# Patient Record
Sex: Male | Born: 1940 | Race: White | Hispanic: No | Smoking: Never smoker
Health system: Southern US, Community
[De-identification: ages and names within clinical notes are randomized; demographics above are authoritative.]

## PROBLEM LIST (undated history)

## (undated) DIAGNOSIS — E079 Disorder of thyroid, unspecified: Secondary | ICD-10-CM

## (undated) DIAGNOSIS — E039 Hypothyroidism, unspecified: Secondary | ICD-10-CM

## (undated) DIAGNOSIS — R0600 Dyspnea, unspecified: Secondary | ICD-10-CM

## (undated) DIAGNOSIS — J449 Chronic obstructive pulmonary disease, unspecified: Secondary | ICD-10-CM

## (undated) DIAGNOSIS — D649 Anemia, unspecified: Secondary | ICD-10-CM

## (undated) DIAGNOSIS — M1990 Unspecified osteoarthritis, unspecified site: Secondary | ICD-10-CM

## (undated) DIAGNOSIS — H269 Unspecified cataract: Secondary | ICD-10-CM

## (undated) DIAGNOSIS — J45909 Unspecified asthma, uncomplicated: Secondary | ICD-10-CM

## (undated) DIAGNOSIS — C801 Malignant (primary) neoplasm, unspecified: Secondary | ICD-10-CM

## (undated) DIAGNOSIS — I10 Essential (primary) hypertension: Secondary | ICD-10-CM

## (undated) DIAGNOSIS — D6851 Activated protein C resistance: Secondary | ICD-10-CM

## (undated) HISTORY — PX: SHOULDER SURGERY: SHX246

## (undated) HISTORY — DX: Unspecified osteoarthritis, unspecified site: M19.90

## (undated) HISTORY — PX: KNEE SURGERY: SHX244

## (undated) HISTORY — DX: Unspecified asthma, uncomplicated: J45.909

## (undated) HISTORY — PX: EYE SURGERY: SHX253

## (undated) HISTORY — PX: APPENDECTOMY: SHX54

## (undated) HISTORY — DX: Unspecified cataract: H26.9

## (undated) HISTORY — DX: Activated protein C resistance: D68.51

## (undated) HISTORY — DX: Disorder of thyroid, unspecified: E07.9

---
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
## 1992-09-19 DIAGNOSIS — H269 Unspecified cataract: Secondary | ICD-10-CM

## 1992-09-19 HISTORY — DX: Unspecified cataract: H26.9

## 1998-09-19 DIAGNOSIS — C801 Malignant (primary) neoplasm, unspecified: Secondary | ICD-10-CM

## 1998-09-19 HISTORY — DX: Malignant (primary) neoplasm, unspecified: C80.1

## 2004-03-19 ENCOUNTER — Other Ambulatory Visit: Payer: Self-pay

## 2005-01-05 ENCOUNTER — Emergency Department (HOSPITAL_COMMUNITY): Admission: EM | Admit: 2005-01-05 | Discharge: 2005-01-05 | Payer: Self-pay | Admitting: Family Medicine

## 2006-03-13 ENCOUNTER — Ambulatory Visit: Payer: Self-pay | Admitting: General Practice

## 2006-05-25 ENCOUNTER — Other Ambulatory Visit: Payer: Self-pay

## 2006-06-12 ENCOUNTER — Ambulatory Visit: Payer: Self-pay | Admitting: General Practice

## 2006-06-22 ENCOUNTER — Encounter: Payer: Self-pay | Admitting: General Practice

## 2006-07-20 ENCOUNTER — Encounter: Payer: Self-pay | Admitting: General Practice

## 2006-08-19 ENCOUNTER — Encounter: Payer: Self-pay | Admitting: General Practice

## 2006-09-19 ENCOUNTER — Encounter: Payer: Self-pay | Admitting: General Practice

## 2006-10-20 ENCOUNTER — Encounter: Payer: Self-pay | Admitting: General Practice

## 2006-11-18 ENCOUNTER — Encounter: Payer: Self-pay | Admitting: General Practice

## 2008-02-29 ENCOUNTER — Ambulatory Visit: Payer: Self-pay

## 2008-03-14 ENCOUNTER — Ambulatory Visit: Payer: Self-pay | Admitting: General Practice

## 2008-03-25 ENCOUNTER — Ambulatory Visit: Payer: Self-pay | Admitting: General Practice

## 2010-01-18 ENCOUNTER — Ambulatory Visit: Payer: Self-pay

## 2010-06-30 ENCOUNTER — Ambulatory Visit: Payer: Self-pay

## 2010-11-19 ENCOUNTER — Ambulatory Visit: Payer: Self-pay | Admitting: Internal Medicine

## 2010-11-24 ENCOUNTER — Ambulatory Visit: Payer: Self-pay | Admitting: Internal Medicine

## 2010-12-19 ENCOUNTER — Ambulatory Visit: Payer: Self-pay | Admitting: Internal Medicine

## 2011-01-18 ENCOUNTER — Ambulatory Visit: Payer: Self-pay | Admitting: Internal Medicine

## 2011-02-18 ENCOUNTER — Ambulatory Visit: Payer: Self-pay | Admitting: Internal Medicine

## 2011-06-10 ENCOUNTER — Ambulatory Visit: Payer: Self-pay | Admitting: Internal Medicine

## 2011-06-20 ENCOUNTER — Ambulatory Visit: Payer: Self-pay | Admitting: Internal Medicine

## 2011-08-16 ENCOUNTER — Ambulatory Visit: Payer: Self-pay | Admitting: Ophthalmology

## 2011-08-16 DIAGNOSIS — R609 Edema, unspecified: Secondary | ICD-10-CM

## 2011-08-29 ENCOUNTER — Ambulatory Visit: Payer: Self-pay | Admitting: Ophthalmology

## 2011-10-14 ENCOUNTER — Ambulatory Visit: Payer: Self-pay | Admitting: Internal Medicine

## 2011-10-14 LAB — CBC CANCER CENTER
Basophil %: 0.6 %
Eosinophil %: 6.6 %
HCT: 41.7 % (ref 40.0–52.0)
Lymphocyte #: 1 x10 3/mm (ref 1.0–3.6)
MCH: 31.5 pg (ref 26.0–34.0)
Monocyte #: 0.3 x10 3/mm (ref 0.0–0.7)
Monocyte %: 9.2 %
Neutrophil #: 2.2 x10 3/mm (ref 1.4–6.5)
WBC: 3.8 x10 3/mm (ref 3.8–10.6)

## 2011-10-21 ENCOUNTER — Ambulatory Visit: Payer: Self-pay | Admitting: Internal Medicine

## 2013-09-19 HISTORY — PX: COLONOSCOPY: SHX174

## 2013-10-21 ENCOUNTER — Ambulatory Visit: Payer: Self-pay | Admitting: Gastroenterology

## 2014-08-27 ENCOUNTER — Inpatient Hospital Stay: Payer: Self-pay | Admitting: General Surgery

## 2014-08-27 LAB — CBC WITH DIFFERENTIAL/PLATELET
BASOS ABS: 0 10*3/uL (ref 0.0–0.1)
Basophil %: 0.3 %
EOS PCT: 0.6 %
Eosinophil #: 0.1 10*3/uL (ref 0.0–0.7)
HCT: 49 % (ref 40.0–52.0)
HGB: 15.8 g/dL (ref 13.0–18.0)
Lymphocyte #: 0.7 10*3/uL — ABNORMAL LOW (ref 1.0–3.6)
Lymphocyte %: 6.5 %
MCH: 30.5 pg (ref 26.0–34.0)
MCHC: 32.3 g/dL (ref 32.0–36.0)
MCV: 95 fL (ref 80–100)
MONOS PCT: 8.3 %
Monocyte #: 0.9 x10 3/mm (ref 0.2–1.0)
NEUTROS ABS: 8.7 10*3/uL — AB (ref 1.4–6.5)
Neutrophil %: 84.3 %
PLATELETS: 204 10*3/uL (ref 150–440)
RBC: 5.18 10*6/uL (ref 4.40–5.90)
RDW: 14 % (ref 11.5–14.5)
WBC: 10.4 10*3/uL (ref 3.8–10.6)

## 2014-08-27 LAB — URINALYSIS, COMPLETE
Bacteria: NONE SEEN
Bilirubin,UR: NEGATIVE
Blood: NEGATIVE
GLUCOSE, UR: NEGATIVE mg/dL (ref 0–75)
Leukocyte Esterase: NEGATIVE
NITRITE: NEGATIVE
PH: 5 (ref 4.5–8.0)
Protein: NEGATIVE
SPECIFIC GRAVITY: 1.025 (ref 1.003–1.030)
WBC UR: 2 /HPF (ref 0–5)

## 2014-08-27 LAB — COMPREHENSIVE METABOLIC PANEL
ALK PHOS: 98 U/L
ANION GAP: 7 (ref 7–16)
AST: 24 U/L (ref 15–37)
Albumin: 3.4 g/dL (ref 3.4–5.0)
BILIRUBIN TOTAL: 0.5 mg/dL (ref 0.2–1.0)
BUN: 27 mg/dL — ABNORMAL HIGH (ref 7–18)
CALCIUM: 9.5 mg/dL (ref 8.5–10.1)
CHLORIDE: 102 mmol/L (ref 98–107)
CREATININE: 1.11 mg/dL (ref 0.60–1.30)
Co2: 30 mmol/L (ref 21–32)
EGFR (Non-African Amer.): >60
Glucose: 133 mg/dL — ABNORMAL HIGH (ref 65–99)
Osmolality: 285 (ref 275–301)
Potassium: 4.6 mmol/L (ref 3.5–5.1)
SGPT (ALT): 20 U/L
Sodium: 139 mmol/L (ref 136–145)
TOTAL PROTEIN: 7.6 g/dL (ref 6.4–8.2)

## 2014-08-27 LAB — LIPASE, BLOOD: LIPASE: 85 U/L (ref 73–393)

## 2014-08-27 LAB — PROTIME-INR
INR: 2.3
Prothrombin Time: 24.4 secs — ABNORMAL HIGH (ref 11.5–14.7)

## 2014-08-27 LAB — AMYLASE: Amylase: 17 U/L — ABNORMAL LOW (ref 25–115)

## 2014-08-27 LAB — MAGNESIUM: Magnesium: 1.9 mg/dL

## 2014-08-27 LAB — TSH: Thyroid Stimulating Horm: 2.98 u[IU]/mL

## 2014-08-28 DIAGNOSIS — K565 Intestinal adhesions [bands] with obstruction (postprocedural) (postinfection): Secondary | ICD-10-CM

## 2014-08-28 LAB — CBC WITH DIFFERENTIAL/PLATELET
BASOS PCT: 0.5 %
Basophil #: 0 10*3/uL (ref 0.0–0.1)
Eosinophil #: 0.2 10*3/uL (ref 0.0–0.7)
Eosinophil %: 2.2 %
HCT: 43.1 % (ref 40.0–52.0)
HGB: 14.1 g/dL (ref 13.0–18.0)
LYMPHS ABS: 1 10*3/uL (ref 1.0–3.6)
Lymphocyte %: 10.6 %
MCH: 30.9 pg (ref 26.0–34.0)
MCHC: 32.8 g/dL (ref 32.0–36.0)
MCV: 94 fL (ref 80–100)
MONO ABS: 1 x10 3/mm (ref 0.2–1.0)
MONOS PCT: 10.4 %
NEUTROS PCT: 76.3 %
Neutrophil #: 7.3 10*3/uL — ABNORMAL HIGH (ref 1.4–6.5)
Platelet: 180 10*3/uL (ref 150–440)
RBC: 4.58 10*6/uL (ref 4.40–5.90)
RDW: 13.7 % (ref 11.5–14.5)
WBC: 9.5 10*3/uL (ref 3.8–10.6)

## 2014-08-28 LAB — BASIC METABOLIC PANEL
ANION GAP: 8 (ref 7–16)
BUN: 24 mg/dL — ABNORMAL HIGH (ref 7–18)
CALCIUM: 9 mg/dL (ref 8.5–10.1)
CHLORIDE: 106 mmol/L (ref 98–107)
CO2: 28 mmol/L (ref 21–32)
Creatinine: 0.98 mg/dL (ref 0.60–1.30)
EGFR (Non-African Amer.): >60
GLUCOSE: 102 mg/dL — AB (ref 65–99)
OSMOLALITY: 287 (ref 275–301)
Potassium: 4.2 mmol/L (ref 3.5–5.1)
Sodium: 142 mmol/L (ref 136–145)

## 2014-08-29 ENCOUNTER — Encounter: Payer: Self-pay | Admitting: General Surgery

## 2014-08-29 DIAGNOSIS — K565 Intestinal adhesions [bands] with obstruction (postprocedural) (postinfection): Secondary | ICD-10-CM

## 2014-08-29 LAB — PROTIME-INR
INR: 3
PROTHROMBIN TIME: 30.2 s — AB (ref 11.5–14.7)

## 2014-08-29 LAB — BASIC METABOLIC PANEL
Anion Gap: 8 (ref 7–16)
BUN: 23 mg/dL — ABNORMAL HIGH (ref 7–18)
CHLORIDE: 108 mmol/L — AB (ref 98–107)
Calcium, Total: 8.6 mg/dL (ref 8.5–10.1)
Co2: 26 mmol/L (ref 21–32)
Creatinine: 0.81 mg/dL (ref 0.60–1.30)
EGFR (African American): >60
Glucose: 91 mg/dL (ref 65–99)
OSMOLALITY: 286 (ref 275–301)
Potassium: 4 mmol/L (ref 3.5–5.1)
Sodium: 142 mmol/L (ref 136–145)

## 2014-08-29 LAB — URINE CULTURE

## 2014-08-30 LAB — BASIC METABOLIC PANEL
Anion Gap: 6 — ABNORMAL LOW (ref 7–16)
BUN: 19 mg/dL — AB (ref 7–18)
CHLORIDE: 109 mmol/L — AB (ref 98–107)
CREATININE: 0.79 mg/dL (ref 0.60–1.30)
Calcium, Total: 8.4 mg/dL — ABNORMAL LOW (ref 8.5–10.1)
Co2: 26 mmol/L (ref 21–32)
GLUCOSE: 67 mg/dL (ref 65–99)
Osmolality: 282 (ref 275–301)
Potassium: 3.8 mmol/L (ref 3.5–5.1)
Sodium: 141 mmol/L (ref 136–145)

## 2014-08-30 LAB — CBC WITH DIFFERENTIAL/PLATELET
Basophil #: 0 10*3/uL (ref 0.0–0.1)
Basophil %: 0.4 %
Eosinophil #: 0.4 10*3/uL (ref 0.0–0.7)
Eosinophil %: 6.6 %
HCT: 40.2 % (ref 40.0–52.0)
HGB: 13.1 g/dL (ref 13.0–18.0)
Lymphocyte #: 0.7 10*3/uL — ABNORMAL LOW (ref 1.0–3.6)
Lymphocyte %: 9.7 %
MCH: 30.5 pg (ref 26.0–34.0)
MCHC: 32.5 g/dL (ref 32.0–36.0)
MCV: 94 fL (ref 80–100)
MONO ABS: 0.6 x10 3/mm (ref 0.2–1.0)
Monocyte %: 8.6 %
Neutrophil #: 5 10*3/uL (ref 1.4–6.5)
Neutrophil %: 74.7 %
PLATELETS: 165 10*3/uL (ref 150–440)
RBC: 4.28 10*6/uL — ABNORMAL LOW (ref 4.40–5.90)
RDW: 13.7 % (ref 11.5–14.5)
WBC: 6.7 10*3/uL (ref 3.8–10.6)

## 2014-08-30 LAB — PROTIME-INR
INR: 2.7
Prothrombin Time: 27.8 secs — ABNORMAL HIGH (ref 11.5–14.7)

## 2014-08-31 LAB — CBC WITH DIFFERENTIAL/PLATELET
BASOS PCT: 0.3 %
Basophil #: 0 10*3/uL (ref 0.0–0.1)
EOS PCT: 3.2 %
Eosinophil #: 0.2 10*3/uL (ref 0.0–0.7)
HCT: 38.4 % — AB (ref 40.0–52.0)
HGB: 12.7 g/dL — AB (ref 13.0–18.0)
Lymphocyte #: 0.7 10*3/uL — ABNORMAL LOW (ref 1.0–3.6)
Lymphocyte %: 10.6 %
MCH: 30.7 pg (ref 26.0–34.0)
MCHC: 33 g/dL (ref 32.0–36.0)
MCV: 93 fL (ref 80–100)
Monocyte #: 0.8 x10 3/mm (ref 0.2–1.0)
Monocyte %: 12.4 %
NEUTROS ABS: 4.6 10*3/uL (ref 1.4–6.5)
Neutrophil %: 73.5 %
Platelet: 164 10*3/uL (ref 150–440)
RBC: 4.13 10*6/uL — AB (ref 4.40–5.90)
RDW: 13.6 % (ref 11.5–14.5)
WBC: 6.3 10*3/uL (ref 3.8–10.6)

## 2014-08-31 LAB — BASIC METABOLIC PANEL
Anion Gap: 9 (ref 7–16)
BUN: 13 mg/dL (ref 7–18)
CO2: 26 mmol/L (ref 21–32)
Calcium, Total: 8.1 mg/dL — ABNORMAL LOW (ref 8.5–10.1)
Chloride: 106 mmol/L (ref 98–107)
Creatinine: 0.63 mg/dL (ref 0.60–1.30)
Glucose: 85 mg/dL (ref 65–99)
OSMOLALITY: 281 (ref 275–301)
POTASSIUM: 3.7 mmol/L (ref 3.5–5.1)
SODIUM: 141 mmol/L (ref 136–145)

## 2014-08-31 LAB — PROTIME-INR
INR: 2.4
PROTHROMBIN TIME: 25.9 s — AB (ref 11.5–14.7)

## 2014-09-01 HISTORY — PX: COLON SURGERY: SHX602

## 2014-09-01 LAB — URIC ACID: URIC ACID: 7 mg/dL (ref 3.5–7.2)

## 2014-09-01 LAB — PROTIME-INR
INR: 2.8
PROTHROMBIN TIME: 28.4 s — AB (ref 11.5–14.7)

## 2014-09-02 ENCOUNTER — Encounter: Payer: Self-pay | Admitting: General Surgery

## 2014-09-02 LAB — CBC WITH DIFFERENTIAL/PLATELET
BASOS ABS: 0 10*3/uL (ref 0.0–0.1)
BASOS PCT: 0.2 %
EOS PCT: 1.2 %
Eosinophil #: 0.1 10*3/uL (ref 0.0–0.7)
HCT: 37.2 % — AB (ref 40.0–52.0)
HGB: 12.1 g/dL — AB (ref 13.0–18.0)
LYMPHS ABS: 0.3 10*3/uL — AB (ref 1.0–3.6)
Lymphocyte %: 5.2 %
MCH: 30.2 pg (ref 26.0–34.0)
MCHC: 32.6 g/dL (ref 32.0–36.0)
MCV: 93 fL (ref 80–100)
MONO ABS: 0.5 x10 3/mm (ref 0.2–1.0)
Monocyte %: 9.4 %
NEUTROS ABS: 4.6 10*3/uL (ref 1.4–6.5)
Neutrophil %: 84 %
PLATELETS: 160 10*3/uL (ref 150–440)
RBC: 4.01 10*6/uL — ABNORMAL LOW (ref 4.40–5.90)
RDW: 13.6 % (ref 11.5–14.5)
WBC: 5.5 10*3/uL (ref 3.8–10.6)

## 2014-09-02 LAB — BASIC METABOLIC PANEL
ANION GAP: 6 — AB (ref 7–16)
BUN: 15 mg/dL (ref 7–18)
CALCIUM: 8.7 mg/dL (ref 8.5–10.1)
CO2: 29 mmol/L (ref 21–32)
Chloride: 105 mmol/L (ref 98–107)
Creatinine: 0.84 mg/dL (ref 0.60–1.30)
EGFR (Non-African Amer.): >60
Glucose: 138 mg/dL — ABNORMAL HIGH (ref 65–99)
OSMOLALITY: 282 (ref 275–301)
Potassium: 4 mmol/L (ref 3.5–5.1)
SODIUM: 140 mmol/L (ref 136–145)

## 2014-09-02 LAB — PROTIME-INR
INR: 1.3
PROTHROMBIN TIME: 16.2 s — AB (ref 11.5–14.7)

## 2014-09-03 LAB — BASIC METABOLIC PANEL
ANION GAP: 2 — AB (ref 7–16)
BUN: 13 mg/dL (ref 7–18)
CALCIUM: 8.4 mg/dL — AB (ref 8.5–10.1)
CHLORIDE: 107 mmol/L (ref 98–107)
CO2: 34 mmol/L — AB (ref 21–32)
CREATININE: 0.71 mg/dL (ref 0.60–1.30)
EGFR (African American): >60
GLUCOSE: 144 mg/dL — AB (ref 65–99)
OSMOLALITY: 288 (ref 275–301)
Potassium: 3.8 mmol/L (ref 3.5–5.1)
SODIUM: 143 mmol/L (ref 136–145)

## 2014-09-03 LAB — CBC WITH DIFFERENTIAL/PLATELET
BASOS ABS: 0 10*3/uL (ref 0.0–0.1)
Basophil %: 0.3 %
EOS ABS: 0 10*3/uL (ref 0.0–0.7)
EOS PCT: 0.4 %
HCT: 37.3 % — AB (ref 40.0–52.0)
HGB: 12.1 g/dL — AB (ref 13.0–18.0)
LYMPHS ABS: 0.7 10*3/uL — AB (ref 1.0–3.6)
LYMPHS PCT: 11.5 %
MCH: 30.1 pg (ref 26.0–34.0)
MCHC: 32.5 g/dL (ref 32.0–36.0)
MCV: 93 fL (ref 80–100)
MONO ABS: 0.7 x10 3/mm (ref 0.2–1.0)
Monocyte %: 11.6 %
NEUTROS ABS: 4.7 10*3/uL (ref 1.4–6.5)
NEUTROS PCT: 76.2 %
PLATELETS: 192 10*3/uL (ref 150–440)
RBC: 4.02 10*6/uL — ABNORMAL LOW (ref 4.40–5.90)
RDW: 13.5 % (ref 11.5–14.5)
WBC: 6.1 10*3/uL (ref 3.8–10.6)

## 2014-09-04 ENCOUNTER — Encounter: Payer: Self-pay | Admitting: General Surgery

## 2014-09-04 LAB — PROTIME-INR
INR: 3.8
Prothrombin Time: 36.4 secs — ABNORMAL HIGH (ref 11.5–14.7)

## 2014-09-05 LAB — CBC WITH DIFFERENTIAL/PLATELET
BASOS ABS: 0 10*3/uL (ref 0.0–0.1)
Basophil %: 0.3 %
EOS PCT: 4.7 %
Eosinophil #: 0.3 10*3/uL (ref 0.0–0.7)
HCT: 36.2 % — ABNORMAL LOW (ref 40.0–52.0)
HGB: 11.9 g/dL — AB (ref 13.0–18.0)
LYMPHS ABS: 1.1 10*3/uL (ref 1.0–3.6)
Lymphocyte %: 19.6 %
MCH: 30.4 pg (ref 26.0–34.0)
MCHC: 32.9 g/dL (ref 32.0–36.0)
MCV: 92 fL (ref 80–100)
MONOS PCT: 9.7 %
Monocyte #: 0.5 x10 3/mm (ref 0.2–1.0)
Neutrophil #: 3.6 10*3/uL (ref 1.4–6.5)
Neutrophil %: 65.7 %
Platelet: 182 10*3/uL (ref 150–440)
RBC: 3.92 10*6/uL — AB (ref 4.40–5.90)
RDW: 13.6 % (ref 11.5–14.5)
WBC: 5.5 10*3/uL (ref 3.8–10.6)

## 2014-09-05 LAB — BASIC METABOLIC PANEL
ANION GAP: 5 — AB (ref 7–16)
BUN: 11 mg/dL (ref 7–18)
CALCIUM: 8.1 mg/dL — AB (ref 8.5–10.1)
CHLORIDE: 103 mmol/L (ref 98–107)
CO2: 35 mmol/L — AB (ref 21–32)
Creatinine: 0.77 mg/dL (ref 0.60–1.30)
EGFR (African American): >60
Glucose: 97 mg/dL (ref 65–99)
OSMOLALITY: 284 (ref 275–301)
Potassium: 3.4 mmol/L — ABNORMAL LOW (ref 3.5–5.1)
SODIUM: 143 mmol/L (ref 136–145)

## 2014-09-05 LAB — PROTIME-INR
INR: 3.1
PROTHROMBIN TIME: 30.8 s — AB (ref 11.5–14.7)

## 2014-09-08 ENCOUNTER — Encounter: Payer: Self-pay | Admitting: General Surgery

## 2014-09-08 ENCOUNTER — Ambulatory Visit (INDEPENDENT_AMBULATORY_CARE_PROVIDER_SITE_OTHER): Payer: Self-pay | Admitting: General Surgery

## 2014-09-08 VITALS — BP 140/70 | HR 76 | Resp 14 | Ht 68.0 in | Wt 186.0 lb

## 2014-09-08 DIAGNOSIS — N401 Enlarged prostate with lower urinary tract symptoms: Secondary | ICD-10-CM | POA: Insufficient documentation

## 2014-09-08 DIAGNOSIS — N400 Enlarged prostate without lower urinary tract symptoms: Secondary | ICD-10-CM

## 2014-09-08 DIAGNOSIS — K565 Intestinal adhesions [bands], unspecified as to partial versus complete obstruction: Secondary | ICD-10-CM | POA: Insufficient documentation

## 2014-09-08 DIAGNOSIS — R338 Other retention of urine: Secondary | ICD-10-CM

## 2014-09-08 NOTE — Progress Notes (Signed)
Patient ID: Tony Hinton, male   DOB: Jan 18, 1941, 73 y.o.   MRN: [id number]  Chief Complaint  Patient presents with  . Routine Post Op    celiotomy    HPI Tony Hinton is a 73 y.o. male who presents for a post op of a celiotomy. The procedure was performed on 09/01/14. The patient is doing well. Bowels are moving. No complaints at this time.   The patient required cystoscopic passage of a urinary bladder catheter after his procedure.  HPI  Past Medical History  Diagnosis Date  . Arthritis   . Asthma   . Factor 5 Leiden mutation, heterozygous   . Cataract 1994    testicular  . Thyroid disease     Past Surgical History  Procedure Laterality Date  . Colon surgery  09/01/2014  . Shoulder surgery Left   . Appendectomy    . Knee surgery Right     History reviewed. No pertinent family history.  Social History History  Substance Use Topics  . Smoking status: Never Smoker   . Smokeless tobacco: Not on file  . Alcohol Use: 0.0 oz/week    0 Not specified per week    Allergies  Allergen Reactions  . Penicillins   . Shellfish Allergy Other (See Comments)    Effects his eyes  . Sulfa Antibiotics Other (See Comments)    Effects eyes    Current Outpatient Prescriptions  Medication Sig Dispense Refill  . allopurinol (ZYLOPRIM) 100 MG tablet Take 100 mg by mouth daily.  1  . folic acid (FOLVITE) 1 MG tablet   1  . HYDROcodone-acetaminophen (NORCO/VICODIN) 5-325 MG per tablet   0  . levothyroxine (SYNTHROID, LEVOTHROID) 50 MCG tablet   1  . PROVENTIL HFA 108 (90 BASE) MCG/ACT inhaler   1  . warfarin (COUMADIN) 1 MG tablet   1  . warfarin (COUMADIN) 7.5 MG tablet   1   No current facility-administered medications for this visit.    Review of Systems Review of Systems  Constitutional: Negative.   Respiratory: Negative.   Cardiovascular: Negative.   Gastrointestinal: Negative.     Blood pressure 140/70, pulse 76, resp. rate 14, height 5\' 8"  (1.727 m), weight 186  lb (84.369 kg).  Physical Exam Physical Exam  Constitutional: He is oriented to person, place, and time. He appears well-developed and well-nourished.  Cardiovascular: Normal rate, regular rhythm and normal heart sounds.   No murmur heard. Pulmonary/Chest: Effort normal and breath sounds normal.  Neurological: He is alert and oriented to person, place, and time.  Skin: Skin is warm and dry.  The patient has a well-healed midline incision. No weakness noted.  Data Reviewed Urinary catheter removed without difficulty.  Assessment    Doing well status post release of adhesive band causing small bowel obstruction  Status post urinary catheter placement for BPH/false passage.    Plan    The patient will follow-up in one month. He was instructed to notify the urology service if he is unable to void later today.    PCP:  Arther Dames, Judene Companion 09/08/2014, 8:38 PM

## 2014-09-08 NOTE — Patient Instructions (Signed)
Patient to return in 3 weeks for follow up. The patient is aware to call back for any questions or concerns.

## 2014-09-30 ENCOUNTER — Encounter: Payer: Self-pay | Admitting: General Surgery

## 2014-09-30 ENCOUNTER — Ambulatory Visit (INDEPENDENT_AMBULATORY_CARE_PROVIDER_SITE_OTHER): Payer: Self-pay | Admitting: General Surgery

## 2014-09-30 VITALS — BP 118/66 | HR 74 | Resp 14 | Ht 66.0 in | Wt 180.0 lb

## 2014-09-30 DIAGNOSIS — K565 Intestinal adhesions [bands], unspecified as to partial versus complete obstruction: Secondary | ICD-10-CM

## 2014-09-30 NOTE — Progress Notes (Addendum)
Patient ID: Tony Hinton, male   DOB: 08-02-1941, 74 y.o.   MRN: [id number]  Chief Complaint  Patient presents with  . Follow-up    small bowel     HPI Tony Hinton is a 74 y.o. male who presents for a post op having undergone exploratory celiotomy and lysis of adhesions for small bowel obstruction. The procedure was performed on 09/01/14. The patient is doing well. Bowels are moving. No complaints at this time.  The patient's report his bowel movements are returning to normal in frequency and consistency. He is not experiencing any postprandial pain. He does report a general sense of muscle weakness that is gradually improving.  The patient reports improved voiding having undergone cystoscopic catheter placement immediately post operatively.  HPI  Past Medical History  Diagnosis Date  . Arthritis   . Asthma   . Factor 5 Leiden mutation, heterozygous   . Cataract 1994    testicular  . Thyroid disease     Past Surgical History  Procedure Laterality Date  . Colon surgery  09/01/2014  . Shoulder surgery Left   . Appendectomy    . Knee surgery Right     No family history on file.  Social History History  Substance Use Topics  . Smoking status: Never Smoker   . Smokeless tobacco: Not on file  . Alcohol Use: 0.0 oz/week    0 Not specified per week    Allergies  Allergen Reactions  . Penicillins   . Sulfa Antibiotics Other (See Comments)    Effects eyes  . Shellfish Allergy Other (See Comments) and Rash    Effects his eyes    Current Outpatient Prescriptions  Medication Sig Dispense Refill  . allopurinol (ZYLOPRIM) 100 MG tablet Take 100 mg by mouth daily.  1  . folic acid (FOLVITE) 1 MG tablet   1  . levothyroxine (SYNTHROID, LEVOTHROID) 50 MCG tablet   1  . PROVENTIL HFA 108 (90 BASE) MCG/ACT inhaler   1  . warfarin (COUMADIN) 1 MG tablet   1  . warfarin (COUMADIN) 7.5 MG tablet   1   No current facility-administered medications for this visit.    Review of  Systems Review of Systems  Constitutional: Positive for chills. Negative for fever, diaphoresis, activity change, appetite change, fatigue and unexpected weight change.  Respiratory: Negative.   Cardiovascular: Negative.     Blood pressure 118/66, pulse 74, resp. rate 14, height 5\' 6"  (1.676 m), weight 180 lb (81.647 kg).  Physical Exam Physical Exam  Constitutional: He is oriented to person, place, and time. He appears well-developed and well-nourished.  Eyes: Conjunctivae are normal. No scleral icterus.  Neck: Neck supple.  Cardiovascular: Normal rate, regular rhythm and normal heart sounds.   Pulmonary/Chest: Effort normal and breath sounds normal.  Abdominal: Soft. Normal appearance and bowel sounds are normal. There is no tenderness.    Lymphadenopathy:    He has no cervical adenopathy.  Neurological: He is alert and oriented to person, place, and time.  Skin: Skin is warm and dry.    Data Reviewed None  Assessment    Doing well status post release of small bowel obstruction.     Plan    The patient will take this week to increase his activity with walking to develop improved exercise tolerance and plan on return to work with light duty (no lifting over 20 pounds) on 10/06/2014. We'll plan for a follow-up examination here in 1 month.  PCP:  Arther Dames,   Robert Bellow 10/01/2014, 7:04 AM

## 2014-09-30 NOTE — Patient Instructions (Signed)
Patient to return ion one month.

## 2014-10-30 ENCOUNTER — Encounter: Payer: Self-pay | Admitting: General Surgery

## 2014-10-30 ENCOUNTER — Ambulatory Visit (INDEPENDENT_AMBULATORY_CARE_PROVIDER_SITE_OTHER): Payer: Self-pay | Admitting: General Surgery

## 2014-10-30 VITALS — BP 148/86 | HR 56 | Resp 12 | Ht 69.0 in | Wt 189.0 lb

## 2014-10-30 DIAGNOSIS — K565 Intestinal adhesions [bands], unspecified as to partial versus complete obstruction: Secondary | ICD-10-CM

## 2014-10-30 NOTE — Progress Notes (Signed)
Patient ID: Tony Hinton, male   DOB: 05-23-1941, 74 y.o.   MRN: [id number]  Chief Complaint  Patient presents with  . Follow-up    HPI Tony Hinton is a 74 y.o. male.  who presents for a post op having undergone exploratory celiotomy and lysis of adhesions for small bowel obstruction. The procedure was performed on 09/01/14. The patient is doing well. Bowels are moving. No complaints at this time. The patient's report his bowel movements are returning to normal in frequency and consistency. Bowels generally move every other day. No new complaints. The patient has returned to work.   HPI  Past Medical History  Diagnosis Date  . Arthritis   . Asthma   . Factor 5 Leiden mutation, heterozygous   . Cataract 1994    testicular  . Thyroid disease     Past Surgical History  Procedure Laterality Date  . Colon surgery  09/01/2014  . Shoulder surgery Left   . Appendectomy    . Knee surgery Right   . Colonoscopy  2015    History reviewed. No pertinent family history.  Social History History  Substance Use Topics  . Smoking status: Never Smoker   . Smokeless tobacco: Not on file  . Alcohol Use: 0.0 oz/week    0 Standard drinks or equivalent per week    Allergies  Allergen Reactions  . Penicillins   . Sulfa Antibiotics Other (See Comments)    Effects eyes  . Shellfish Allergy Other (See Comments) and Rash    Effects his eyes    Current Outpatient Prescriptions  Medication Sig Dispense Refill  . allopurinol (ZYLOPRIM) 100 MG tablet Take 100 mg by mouth daily.  1  . folic acid (FOLVITE) 1 MG tablet   1  . levothyroxine (SYNTHROID, LEVOTHROID) 50 MCG tablet   1  . PROVENTIL HFA 108 (90 BASE) MCG/ACT inhaler   1  . warfarin (COUMADIN) 1 MG tablet Take 0.5 mg by mouth daily at 6 PM.   1  . warfarin (COUMADIN) 7.5 MG tablet Take 7.5 mg by mouth daily at 6 PM.   1   No current facility-administered medications for this visit.    Review of Systems Review of Systems   Constitutional: Negative.   Respiratory: Negative.   Cardiovascular: Negative.   Gastrointestinal: Negative for nausea, vomiting, abdominal pain, diarrhea and constipation.    Blood pressure 148/86, pulse 56, resp. rate 12, height 5\' 9"  (1.753 m), weight 189 lb (85.73 kg).  The patient's weight is up 9 pounds from his last visit.  Physical Exam Physical Exam  Constitutional: He is oriented to person, place, and time. He appears well-developed and well-nourished.  Abdominal: Soft. Normal appearance. There is no tenderness. No hernia.    Abdominal incision well healed.  Neurological: He is alert and oriented to person, place, and time.  Skin: Skin is warm and dry.      Assessment    Doing well status post release of small bowel obstruction    Plan    The patient is released to full activity. He'll use good judgment was strenuous activity.     Follow up as needed. Proper lifting techniques reviewed. The patient is aware to call back for any questions or concerns.  PCP:  Earvin Hansen 10/31/2014, 7:38 PM

## 2014-10-30 NOTE — Patient Instructions (Signed)
Follow up as needed. Proper lifting techniques reviewed. The patient is aware to call back for any questions or concerns.

## 2014-10-31 ENCOUNTER — Encounter: Payer: Self-pay | Admitting: General Surgery

## 2015-01-10 NOTE — Consult Note (Signed)
Chief Complaint:  Subjective/Chief Complaint Foley catheter draining well this a.m.  He has no GU complaints.  Preoperative voiding symptoms reviewed, he does endorse small caliber stream but no other obstructive voiding symptoms.   VITAL SIGNS/ANCILLARY NOTES: **Vital Signs.:   15-Dec-15 02:10  Vital Signs Type Routine  Temperature Temperature (F) 97.5  Celsius 36.3  Temperature Source oral  Pulse Pulse 64  Respirations Respirations 18  Systolic BP Systolic BP 654  Diastolic BP (mmHg) Diastolic BP (mmHg) 76  Mean BP 95  Pulse Ox % Pulse Ox % 92  Oxygen Delivery Room Air/ 21 %   Brief Assessment:  GEN well developed, well nourished, no acute distress   Cardiac -- LE edema   Gastrointestinal NG tube in place, lower abdominal incision dressing clean dry and intact   Gastrointestinal details normal Nondistended  appropriately tender   EXTR negative edema   Additional Physical Exam Foley catheter in place anchored to right thigh, draining clear yellow urine   Lab Results: Routine Chem:  15-Dec-15 04:39   Glucose, Serum  138  BUN 15  Sodium, Serum 140  Potassium, Serum 4.0  Chloride, Serum 105  CO2, Serum 29  Calcium (Total), Serum 8.7  Anion Gap  6  Osmolality (calc) 282  eGFR (African American) >60  eGFR (Non-African American) >60 (eGFR values <5m/min/1.73 m2 may be an indication of chronic kidney disease (CKD). Calculated eGFR, using the MRDR Study equation, is useful in  patients with stable renal function. The eGFR calculation will not be reliable in acutely ill patients when serum creatinine is changing rapidly. It is not useful in patients on dialysis. The eGFR calculation may not be applicable to patients at the low and high extremes of body sizes, pregnant women, and vegetarians.)  Routine Coag:  15-Dec-15 04:39   Prothrombin  16.2  INR 1.3 (INR reference interval applies to patients on anticoagulant therapy. A single INR therapeutic range for  coumarins is not optimal for all indications; however, the suggested range for most indications is 2.0 - 3.0. Exceptions to the INR Reference Range may include: Prosthetic heart valves, acute myocardial infarction, prevention of myocardial infarction, and combinations of aspirin and anticoagulant. The need for a higher or lower target INR must be assessed individually. Reference: The Pharmacology and Management of the Vitamin K  antagonists: the seventh ACCP Conference on Antithrombotic and Thrombolytic Therapy. CYTKPT.4656Sept:126 (3suppl): [id number] A HCT value >55% may artifactually increase the PT.  In one study,  the increase was an average of 25%. Reference:  "Effect on Routine and Special Coagulation Testing Values of Citrate Anticoagulant Adjustment in Patients with High HCT Values." American Journal of Clinical Pathology 2006;126:400-405.)  Routine Hem:  15-Dec-15 04:39   WBC (CBC) 5.5  RBC (CBC)  4.01  Hemoglobin (CBC)  12.1  Hematocrit (CBC)  37.2  Platelet Count (CBC) 160  MCV 93  MCH 30.2  MCHC 32.6  RDW 13.6  Neutrophil % 84.0  Lymphocyte % 5.2  Monocyte % 9.4  Eosinophil % 1.2  Basophil % 0.2  Neutrophil # 4.6  Lymphocyte #  0.3  Monocyte # 0.5  Eosinophil # 0.1  Basophil # 0.0 (Result(s) reported on 02 Sep 2014 at 05:08AM.)   Assessment/Plan:  Invasive Device Daily Assessment of Necessity:  Does the patient currently have any of the following indwelling devices? 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  Suspect underlying stricutre related to history of pelvic radiation.   -Please maintain Foley catheter 5-7 days to allow urethral healing, recurrence of stricture -if patient is discharged prior to this,  please call our office to arrange outpatient voiding trial -plan discussed with patient today in detail   Electronic Signatures: Sherlynn Stalls (MD)  (Signed 15-Dec-15 08:51)  Authored: Chief Complaint, VITAL SIGNS/ANCILLARY NOTES, Brief Assessment, Lab Results, Assessment/Plan   Last Updated: 15-Dec-15 08:51 by Sherlynn Stalls (MD)

## 2015-01-10 NOTE — Op Note (Signed)
PATIENT NAME:  Tony Hinton, Tony Hinton MR#:  [id number] DATE OF BIRTH:  08-03-1941  DATE OF PROCEDURE:  09/01/2014  PREOPERATIVE DIAGNOSIS: Small bowel obstruction.   POSTOPERATIVE DIAGNOSIS: Small bowel obstruction.  OPERATIVE PROCEDURE: Exploratory celiotomy, release of band adhesion.   SURGEON: Hervey Ard, MD   ANESTHESIA: General endotracheal under Dr. Benjamine Mola.   ESTIMATED BLOOD LOSS: 25 mL.  FLUID REPLACEMENT: Crystalloid 350 mL.   CLINICAL NOTE: This 74 year old male was admitted with nausea and vomiting and plain films suggestive of partial small bowel obstruction. A nasogastric tube was placed while attempts were made to correct his elevated pro time. 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 He was felt to be a candidate for exploration.   OPERATIVE NOTE: With the patient under adequate general endotracheal anesthesia, Foley catheter was attempted to be placed by the nurse and myself. It was not possible to pass the prosthetic urethra. This was initially tried with a 71 [place] and then a 14 [place] catheter. In spite of lubricating the urethra, (which required slight dilatation of urethral stricture at the very opening on the glans) it was not possible to pass the catheter. The abdomen was prepped with ChloraPrep and draped. A lower midline incision was made and carried down through the skin and subcutaneous tissue with hemostasis achieved by electrocautery. Of note, the patient received 2 units of fresh frozen plasma immediately prior to the procedure due to an elevated PT, INR of 2.8. No unusual bleeding was encountered, The abdomen was opened. No odor. Scant free fluid. Inspection showed dilated loops of small bowel just to the left of the midline and decompressed bowel just right of the midline. The patient was found to have a single adhesive band  between the retroperitoneum and the base of the small bowel mesentery to the adjacent colon. This had compressed the bowel with proximal dilatation and mild erythema but no evidence of ischemia. This band was freed under direct vision and the remaining small bowel was run from the ligament of Treitz to just proximal to the terminal ileum (where adhesions of the omentum to the decompressed right colon and TI prevented easy visualization). No other areas of concern were noted. The pelvis was otherwise free of adhesive disease. The succus entericus was milked proximally and approximately [phone] mL plus air was returned through the nasogastric tube. This was initially found to be just barely within the gastric body and with advancement by the nurse anesthetist was placed into the body of the stomach with good return. The abdomen was irrigated with warm saline. With the sponge, tape and instrument count correct, the peritoneum was closed with a running 2-0 Vicryl. The rectus was closed with interrupted 0 PDS sutures. The adipose layer was closed in layers with running 2-0 Vicryl after irrigation. Skin was closed with staples. A dry honeycomb dressing was applied and the patient taken to the recovery room.   Attempts to catheterize the bladder with an 51 French coude catheter were unsuccessful and Dr. Hollice Espy from urology was kind enough to come in and see the patient and place a catheter.    ____________________________ Robert Bellow, MD jwb:TT D: 09/01/2014 21:38:53 ET T: 09/01/2014 22:07:46 ET JOB#: [id number]  cc: Robert Bellow, MD, <Dictator> Adin Hector, MD Sian Joles Amedeo Kinsman MD ELECTRONICALLY SIGNED 09/02/2014 19:07

## 2015-01-10 NOTE — Consult Note (Signed)
PATIENT NAME:  Tony Hinton, Tony Hinton MR#:  [id number] DATE OF BIRTH:  [birth year]  DATE OF CONSULTATION:  08/28/2014  REFERRING PHYSICIAN:   CONSULTING PHYSICIAN:  S.G. Jamal Collin, MD  REQUESTING PHYSICIAN: Dr. Ramonita Lab.    REASON: Nausea, vomiting, suspected bowel obstruction.   HISTORY: This is a 74 year old male who was admitted yesterday evening with a complaint of significant nausea and vomiting. Says he was well up until the beginning of the week and had a normal bowel movement on Monday of this week, which was 3 days ago. Then he started to develop some vague abdominal pain and 2 days ago he developed nausea and vomiting and he has vomited at least 3 times, the last 2 being yesterday on the way to the hospital and after arriving at the hospital. He complained of pain that was nonfocal in the abdomen. It was noted by his wife that the vomitus had a feculent border although the vomit appeared to be mostly bilious in nature. Since admission yesterday he has not had any further episode of vomiting. His pain however it is still there, but not as bad as it has been. He has passed a very little amount of gas and has not had a bowel movement since 3 days ago.   PAST MEDICAL HISTORY: Significant for multiple issues. 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 He has had an appendectomy and a right inguinal hernia repair in the past. 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 He has multiple other histories which are already outlined in the admitting H and P and these were reviewed.   CURRENT MEDICATIONS: Include vitamin D, colchicine, Flexeril, B12, Restasis, folate, Norco, levothyroxine, Proventil inhaler, and Coumadin.   REVIEW OF SYSTEMS: The patient denies any fever or chills. He has abdominal pain and  nausea and vomiting and constipation at present.  No chest pain or shortness of breath. No leg pain at this time.   PHYSICAL EXAMINATION:  GENERAL: The patient is not in any acute distress.  VITAL SIGNS:  Noted to be stable since admission. His heart rate is normal. HEENT: Conjunctivae are pink. Sclerae nonicteric.  NECK: Supple. No nodes or masses palpable.  LUNGS: Clear to auscultation and percussion.  HEART: Sinus rhythm without any murmurs.  ABDOMEN: Soft abdomen which is relatively flat at this time. There is no evidence of any hernias. He has some nonfocal tenderness in his abdomen.   LABORATORY DATA: Reveals normal chemistries. White count is normal, hemoglobin is normal. The patient had a CT scan of the abdomen done this morning which shows some mildly dilated small bowel which extends from the proximal portion all the way to a distal area where there appears to be some sort of a transition in the lower left abdomen. The colon appears normal. There is evidence of a sizable hiatal hernia.   IMPRESSION: History and CT findings suggesting a partial small bowel obstruction. At this point, he seems to have been relatively stable with no further vomiting in the last 24 hours.  I recommended that we continue with the conservative management at this point. I do not think he needs an NG tube, however will try a Fleet enema this evening and followup x-rays tomorrow morning and proceed accordingly.   Thank you for allowing me to  evaluate and help in the care of this patient.     ____________________________ S.Robinette Haines, MD sgs:bu D: 08/28/2014 19:55:48 ET T: 08/28/2014 20:35:44 ET JOB#: [id number]  cc: S.G. Jamal Collin, MD, <Dictator> [facility] Robinette Haines MD ELECTRONICALLY SIGNED 09/01/2014 9:16

## 2015-01-10 NOTE — H&P (Signed)
PATIENT NAME:  Tony Hinton, Tony Hinton MR#:  [id number] DATE OF BIRTH:  10/21/1940  DATE OF ADMISSION:  08/27/2014  CHIEF COMPLAINT: Abdominal pain with refractory nausea and vomiting.   HISTORY OF PRESENT ILLNESS: The patient is a 74 year old male with history of reactive airways disease, BPH, prior clots on chronic anticoagulation therapy secondary to history of factor V Leiden heterozygosity. He states that he was in his usual state of health until about Tuesday, when he began having some epigastric and lower quadrant abdominal discomfort. He then began having vomiting, and this continued over the course of the next 24 hours. Initially the vomit was bilious, but has had 2 episodes where he brought up what is reported as brownish material. His ex-wife, who is a Marine scientist, felt that this looked and smelled like feces.   On examination, he has biliary material on his pants, were he has vomited, although do not see fecal matter. Examination reveals tenderness to deep palpation in the epigastric region.   He is admitted now with refractory nausea and vomiting, possibly with feculent vomiting, which would be worrisome for bowel obstruction.   PAST MEDICAL HISTORY:  1.  Reactive airway disease.  2.  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  3.  Benign prostatic hypertrophy.  4.  History of Fuchs dystrophy of the cornea, status post bilateral cornea transplants.  5.  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  6.  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  7.  History of internal hemorrhoids.  8.  Gout.  9.  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  10.  History of anemia with iron deficiency and folate deficiency.  11.  B12 deficiency with chronic leukopenia.  12.  Status post appendectomy.  13.  History of right knee surgery.  14.   History of left inguinal hernia repair.  15.  History of bilateral cataract repairs.  16.  History of left rotator cuff surgery.  17.  History of right knee arthroscopy, complicated by hematoma.   ALLERGIES: PENICILLIN, SULFA, SHELLFISH.   MEDICATIONS:  1.  Vitamin D 2000 units p.o. daily.  2.  Colchicine  0.6 mg p.o. daily as needed for gout flares.  3.  Vitamin B12 1000 mcg p.o. daily.  4.  Flexeril 10 mg p.o. b.i.d. p.r.n. muscle pain.  5.  Restasis 0.05% drops to both eyes as directed.  6.  Folate 1 mg p.o. daily.  7.  Norco 5/325 1 p.o. q. 8 hours p.r.n. severe pain.  8.  Levothyroxine 0.05 mg p.o. daily for hypothyroidism.  9.  Proventil HFA metered dose inhaler 2 puffs 4 times a day as needed for wheezing.  10.  Coumadin 8 mg p.o. daily.   SOCIAL HISTORY: No tobacco. Remote alcohol.   FAMILY HISTORY: Coronary artery disease and diabetes.   REVIEW OF SYSTEMS: Please see HPI. Denies fevers. Felt like he might have some chills. Last bowel movement about 48 hours ago, without blood or melena. No urinary symptoms. The remainder of a complete review of systems is negative.   PHYSICAL EXAMINATION:  VITAL SIGNS: Temperature 98.5, blood pressure 150/70, pulse 96. Height 5 foot 9 inches, weight 180 pounds.  GENERAL: Well-developed, well-nourished male, who appears ill.  EYES: Pupils are postoperative. Lids and conjunctivae are unremarkable.  EARS, NOSE AND THROAT: External examination unremarkable. The oropharynx is dry without lesions.  NECK: Supple. Trachea  midline. No thyromegaly.  CARDIOVASCULAR: Regular rate and rhythm, without gallops or rubs. Carotid and radial pulses 2+.  LUNGS: Clear bilaterally without wheeze or retractions.  ABDOMEN: Soft, nondistended. Very quiet bowel sounds. Tenderness to deep palpation in the epigastric region without rebound or Murphy's.  SKIN: Diffuse dry skin.  LYMPH NODES: No cervical or supraclavicular nodes.  MUSCULOSKELETAL: No clubbing or  cyanosis. Postoperative changes in the right knee. No edema.  NEUROLOGIC: Cranial nerves are intact. Decreased light touch in both feet is unchanged from previous exams. Motor strength appears intact.   IMPRESSION AND PLAN:  1.  Refractory nausea and vomiting with possible feculent vomiting. We will make him n.p.o., placed him on IV fluid resuscitation, getting STAT MET-C, CBC, lipase, amylase. Get abdominal films to evaluate for obstruction. Pain medications and nausea medications to be used as needed.  2.  History of deep vein thrombosis. We will hold Coumadin for now, until we make sure that he is not going to need some sort of intervention. Checking INR today.  3.  Hypothyroidism. Check TSH and adjust medications if necessary.  4.  Asthma. No evidence of wheezing on exam currently. Albuterol SVNs to be used as needed.    ____________________________ Adin Hector, MD bjk:MT D: 08/27/2014 17:17:52 ET T: 08/27/2014 17:52:34 ET JOB#: [id number]  cc: Adin Hector, MD, <Dictator> Ramonita Lab MD ELECTRONICALLY SIGNED 09/01/2014 8:35

## 2015-01-10 NOTE — Consult Note (Signed)
PATIENT NAME:  Tony Hinton, Tony Hinton MR#:  [id number] DATE OF BIRTH:  02/23/1941  DATE OF CONSULTATION:    REFERRING PHYSICIAN:   CONSULTING PHYSICIAN:  S.G. Jamal Collin, MD  REQUESTING PHYSICIAN: Dr. Ramonita Lab.    REASON: Nausea,  vomiting, suspected bowel obstruction.   HISTORY: This is a 74 year old male who was admitted yesterday evening with a complaint of significant nausea and vomiting. Says he was well up until the beginning of the week and had a normal bowel movement on Monday of this week, which was 3 days ago. Then he started to develop some vague abdominal pain and 2 days ago he developed nausea and vomiting and he has vomited at least 3 times, the last 2 being yesterday on the way to the hospital and after arriving at the hospital. He complained of pain that was nonfocal in the abdomen. It was noted by his wife that the vomitus had a feculent border although the vomit appear to be mostly bilious in nature. Since admission yesterday he has not had any further episode of vomiting. His pain however it is still there, but not as bad as it has been. He has passed a very little amount of gas and has not had a bowel movement since 3 days ago.   PAST MEDICAL HISTORY: Significant for multiple issues. 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 He has had an appendectomy and a right inguinal hernia repair in the past. 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 He has multiple other histories which are already outlined in the admitting H and P and these were reviewed.   CURRENT MEDICATIONS: Include vitamin D, colchicine, Flexeril, B12, Restasis, folate, Norco, levothyroxine, Proventil inhaler, and Coumadin.   REVIEW OF SYSTEMS: The patient denies any fever or chills. He has abdominal pain and nausea and  vomiting and constipation at present.  No chest pain or shortness of breath. No leg pain at this time.   PHYSICAL EXAMINATION:  GENERAL: The patient is not in any acute distress.  VITAL SIGNS:  Noted to be stable since admission. His heart rate is normal. HEENT: Conjunctivae are pink. Sclerae nonicteric.  NECK: Supple. No nodes or masses palpable.  LUNGS: Clear to auscultation and percussion.  HEART: Sinus rhythm without any murmurs.  ABDOMEN: Soft abdomen which is relatively flat at this time. There is no evidence of any hernias. He has some nonfocal tenderness in his abdomen.   LABORATORY DATA: Reveals normal chemistries. White count is normal, hemoglobin is normal. The patient had a CT scan of the abdomen done this morning which shows some mildly dilated small bowel which extends from the proximal portion all the way to a distal area where there appears to be some sort of a transition in the lower left abdomen. The colon appears normal. There is evidence of a sizable hiatal hernia.   IMPRESSION: History and CT findings suggesting a partial small bowel obstruction. At this point, he seems to have been relatively stable with no further vomiting in the last 24 hours.  I recommended that we continue with the conservative management at this point. I do not think he needs an NG tube, however will try a Fleet enema this evening and followup x-rays tomorrow morning and proceed accordingly.   Thank you for allowing me  to evaluate and help in the care of this patient.     ____________________________ S.Robinette Haines, MD sgs:bu D: 08/28/2014 19:55:48 ET T: 08/28/2014 20:34:06 ET JOB#: [id number]  cc: S.G. Jamal Collin, MD, <Dictator> [facility] Robinette Haines MD ELECTRONICALLY SIGNED 09/01/2014 9:15

## 2015-01-14 NOTE — Op Note (Signed)
PATIENT NAME:  Tony Hinton, Tony Hinton MR#:  [id number] DATE OF BIRTH:  1940/11/02  DATE OF PROCEDURE:  09/01/2014  REASON FOR CONSULTATION:  Difficulty with Foley catheter in the operating room.   HISTORY OF PRESENT ILLNESS:  This is a 74 year old male who was taken earlier today to the operating room for treatment of a small-bowel obstruction. Intraoperatively, a Foley catheter was attempted to be placed using several different size catheters including a 16 [place] and 14 [place]. They were unsuccessful in placing the Foley catheter. In the PACU, additional catheters were attempted using an 18 Coude without success with some urethral bleeding. I was asked to attempt Foley catheter placement at the bedside in recovery.   Of note, the patient does not endorse a history of significant voiding symptoms and denies a history of previous Foley catheters or urethral trauma. He does have a history of pelvic radiation for treatment of his testicular cancer in the 90s.   They also attempted placement of a 16 French Coude catheter but met resistance at the level of either the bulb or the prostatic urethra; it is a little unclear. There was gross blood per urethral meatus and on the catheter at the time of this attempt.   PROCEDURE:  The patient was consented and draped in standard sterile fashion. The penis was prepped using Betadine solution and lidocaine gel that was injected per urethral meatus. A 16 French flexible cystoscope was then advanced per urethra into the bulbar urethra, where a blind-ending pouch with some slight oozing was noted. This appeared to be a large false pass and the scope was backed up slightly, at which time a very tiny true lumen was identified at the 11 o'clock position. 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 The wire was passed under direct visualization through this narrow passage into the bladder. The 6 French cystoscope was able to be passed into the  bladder itself but with some difficulty through this tight area. The wire was left in place, and the scope was removed. At this point, I then attempted to place a 16 [place] [employer] tip catheter over the wire, however, met resistance at the level of the stricture. I then used plastic urethral dilators starting with 18 French up to 15 [place] in order to softly dilate the stricture. Once this was achieved, the 90 [place] Foley was able to be passed into the bladder, where there was drainage of yellow urine and the balloon was inflated with 10 mL of water. The bladder was then drained of clear yellow urine. The Foley catheter was secured to the patient's right leg.   RECOMMENDATIONS:  1.  Please leave Foley catheter 5 to 7 days given the presence of a bulbar urethral stricture as well as large false pass to allow urethra to heal.  2.  If patient is discharged prior to this, he can follow up in our clinic for a voiding trial at [place] or if he has a private urologist can follow up there.   Thank you for allowing me to participate in the care of this patient. Please call with any questions or concerns.     ____________________________ Sherlynn Stalls, MD ajb:nb D: 09/01/2014 21:13:16 ET T: 09/01/2014 22:09:17 ET JOB#: [id number]  cc: Sherlynn Stalls, MD, <Dictator> Sherlynn Stalls MD ELECTRONICALLY SIGNED 10/01/2014 15:40

## 2015-01-18 NOTE — Discharge Summary (Signed)
PATIENT NAME:  Tony Hinton, Tony Hinton MR#:  [id number] DATE OF BIRTH:  08-21-1941  DATE OF ADMISSION:  08/27/2014 DATE OF DISCHARGE:  09/05/2014   DISCHARGE DIAGNOSIS: Partial small bowel obstruction secondary to adhesion.   CLINICAL NOTE: This 74 year old male had been in his usual state of health until shortly prior to admission when he presented with abdominal pain, nausea and vomiting. His past medical history is notable for hyperactive airway disease, previous radiation therapy for a left testicular seminoma, benign prostatic hypertrophy, previous deep vein thrombosis secondary to factor V Leiden deficiency on chronic anticoagulation, B12 deficiency, as well as a number of other minor issues.   The patient was seen in consultation originally by Dr. Jamal Hinton and with findings suggestive of a CT scan, observation without NG was planned.  The patient unfortunately failed this approach and subsequently came to the Operating Room on 09/01/2014, at which time he was found to have a small bowel obstruction secondary to a single band adhesion in the pelvis.   His postoperative course was notable for most of his difficulty related to a urethral stricture requiring cystoscopic Foley placement by Tony Espy, MD from the urology department. The patient had a rapid return of GI function. He was begun on clear liquids and was gradually advanced to a soft diet, which he tolerated well. He had minimal narcotic requirements in the postoperative time frame. At the time of discharge, he was ambulating well, tolerating a soft diet and was felt to be a candidate for discharge home with the use of a long-term indwelling Foley.   MEDICATIONS AT DISCHARGE:  Include Proventil 2 puffs every 4 hours p.r.n., folic acid 1 mg daily,  cod liver oil daily, B12 daily, warfarin 6 mg daily, prednisone 10 mg taper as prescribed by Dr. Caryl Hinton, allopurinol 100 mg daily, Protonix 40 mg daily, and Norco as needed for pain.   FOLLOW UP:   Arrangements were made for follow-up in my office within the week for Foley catheter removal.   SUMMARY OF LABORATORY STUDIES: At the time of admission, the patient had a hemoglobin of 15.8, and at discharge 11.9 with a normal MCV of 92 and a platelet count of 182,000.  Normal white count and differential.  Admission albumin was 3.4 with normal liver function studies. Creatinine on admission was 1.1 and at discharge 0.7.  He did show mild hypokalemia with potassium at 3.4 at the time of discharge.      ____________________________ Tony Bellow, MD jwb:DT D: 09/25/2014 15:18:44 ET T: 09/25/2014 16:13:53 ET JOB#: [id number]  cc: Tony Bellow, MD, <Dictator> Adin Hector, MD Sherlynn Stalls, MD  JEFFREY Amedeo Kinsman MD ELECTRONICALLY SIGNED 09/26/2014 14:44

## 2018-11-09 ENCOUNTER — Telehealth (HOSPITAL_COMMUNITY): Payer: Self-pay

## 2018-11-09 ENCOUNTER — Other Ambulatory Visit: Payer: Self-pay | Admitting: Physician Assistant

## 2018-11-09 DIAGNOSIS — M7541 Impingement syndrome of right shoulder: Secondary | ICD-10-CM

## 2018-11-15 ENCOUNTER — Encounter (HOSPITAL_COMMUNITY): Payer: Self-pay

## 2018-11-15 ENCOUNTER — Ambulatory Visit (HOSPITAL_COMMUNITY): Payer: Medicare HMO

## 2018-11-17 ENCOUNTER — Ambulatory Visit (HOSPITAL_COMMUNITY)
Admission: RE | Admit: 2018-11-17 | Discharge: 2018-11-17 | Disposition: A | Discharge status: Home / Self Care | Payer: Medicare HMO | Source: Ambulatory Visit | Attending: Physician Assistant | Admitting: Physician Assistant

## 2018-11-17 DIAGNOSIS — M7541 Impingement syndrome of right shoulder: Secondary | ICD-10-CM | POA: Diagnosis not present

## 2019-03-21 ENCOUNTER — Other Ambulatory Visit (HOSPITAL_COMMUNITY): Payer: Self-pay | Admitting: Sports Medicine

## 2019-03-21 ENCOUNTER — Other Ambulatory Visit: Payer: Self-pay | Admitting: Sports Medicine

## 2019-03-21 DIAGNOSIS — M25412 Effusion, left shoulder: Secondary | ICD-10-CM

## 2019-03-21 DIAGNOSIS — T148XXA Other injury of unspecified body region, initial encounter: Secondary | ICD-10-CM

## 2019-03-21 DIAGNOSIS — R6883 Chills (without fever): Secondary | ICD-10-CM

## 2019-03-21 DIAGNOSIS — M7552 Bursitis of left shoulder: Secondary | ICD-10-CM

## 2019-03-29 ENCOUNTER — Ambulatory Visit (HOSPITAL_COMMUNITY)
Admission: RE | Admit: 2019-03-29 | Discharge: 2019-03-29 | Disposition: A | Discharge status: Home / Self Care | Payer: Medicare HMO | Source: Ambulatory Visit | Attending: Sports Medicine | Admitting: Sports Medicine

## 2019-03-29 DIAGNOSIS — M25512 Pain in left shoulder: Secondary | ICD-10-CM | POA: Insufficient documentation

## 2019-03-29 DIAGNOSIS — M25412 Effusion, left shoulder: Secondary | ICD-10-CM | POA: Diagnosis present

## 2019-03-29 DIAGNOSIS — R6883 Chills (without fever): Secondary | ICD-10-CM | POA: Insufficient documentation

## 2019-03-29 DIAGNOSIS — T148XXA Other injury of unspecified body region, initial encounter: Secondary | ICD-10-CM | POA: Insufficient documentation

## 2019-03-29 DIAGNOSIS — M7552 Bursitis of left shoulder: Secondary | ICD-10-CM | POA: Insufficient documentation

## 2019-03-31 ENCOUNTER — Ambulatory Visit (HOSPITAL_COMMUNITY): Payer: Medicare HMO

## 2019-04-10 ENCOUNTER — Observation Stay
Admission: EM | Admit: 2019-04-10 | Discharge: 2019-04-12 | Disposition: A | Discharge status: Home / Self Care | Payer: Medicare HMO | Attending: Internal Medicine | Admitting: Internal Medicine

## 2019-04-10 ENCOUNTER — Other Ambulatory Visit: Payer: Self-pay

## 2019-04-10 ENCOUNTER — Emergency Department: Payer: Medicare HMO

## 2019-04-10 DIAGNOSIS — Z880 Allergy status to penicillin: Secondary | ICD-10-CM | POA: Diagnosis not present

## 2019-04-10 DIAGNOSIS — Z66 Do not resuscitate: Secondary | ICD-10-CM | POA: Insufficient documentation

## 2019-04-10 DIAGNOSIS — D6851 Activated protein C resistance: Principal | ICD-10-CM | POA: Insufficient documentation

## 2019-04-10 DIAGNOSIS — M1990 Unspecified osteoarthritis, unspecified site: Secondary | ICD-10-CM | POA: Insufficient documentation

## 2019-04-10 DIAGNOSIS — Z881 Allergy status to other antibiotic agents status: Secondary | ICD-10-CM | POA: Insufficient documentation

## 2019-04-10 DIAGNOSIS — Z882 Allergy status to sulfonamides status: Secondary | ICD-10-CM | POA: Insufficient documentation

## 2019-04-10 DIAGNOSIS — R040 Epistaxis: Secondary | ICD-10-CM | POA: Diagnosis not present

## 2019-04-10 DIAGNOSIS — R791 Abnormal coagulation profile: Secondary | ICD-10-CM | POA: Diagnosis present

## 2019-04-10 DIAGNOSIS — D62 Acute posthemorrhagic anemia: Secondary | ICD-10-CM | POA: Diagnosis present

## 2019-04-10 DIAGNOSIS — J449 Chronic obstructive pulmonary disease, unspecified: Secondary | ICD-10-CM | POA: Insufficient documentation

## 2019-04-10 DIAGNOSIS — E039 Hypothyroidism, unspecified: Secondary | ICD-10-CM | POA: Insufficient documentation

## 2019-04-10 DIAGNOSIS — K921 Melena: Secondary | ICD-10-CM | POA: Diagnosis not present

## 2019-04-10 DIAGNOSIS — Z791 Long term (current) use of non-steroidal anti-inflammatories (NSAID): Secondary | ICD-10-CM | POA: Insufficient documentation

## 2019-04-10 DIAGNOSIS — Z1159 Encounter for screening for other viral diseases: Secondary | ICD-10-CM | POA: Diagnosis not present

## 2019-04-10 DIAGNOSIS — R55 Syncope and collapse: Secondary | ICD-10-CM | POA: Diagnosis not present

## 2019-04-10 DIAGNOSIS — Z79899 Other long term (current) drug therapy: Secondary | ICD-10-CM | POA: Insufficient documentation

## 2019-04-10 DIAGNOSIS — Z79890 Hormone replacement therapy (postmenopausal): Secondary | ICD-10-CM | POA: Diagnosis not present

## 2019-04-10 DIAGNOSIS — Z7901 Long term (current) use of anticoagulants: Secondary | ICD-10-CM | POA: Insufficient documentation

## 2019-04-10 DIAGNOSIS — M25512 Pain in left shoulder: Secondary | ICD-10-CM | POA: Insufficient documentation

## 2019-04-10 LAB — BASIC METABOLIC PANEL
Anion gap: 11 (ref 5–15)
BUN: 42 mg/dL — ABNORMAL HIGH (ref 8–23)
CO2: 25 mmol/L (ref 22–32)
Calcium: 9 mg/dL (ref 8.9–10.3)
Chloride: 98 mmol/L (ref 98–111)
Creatinine, Ser: 1.03 mg/dL (ref 0.61–1.24)
GFR calc Af Amer: >60 mL/min (ref >60)
GFR calc non Af Amer: >60 mL/min (ref >60)
Glucose, Bld: 170 mg/dL — ABNORMAL HIGH (ref 70–99)
Potassium: 4.2 mmol/L (ref 3.5–5.1)
Sodium: 134 mmol/L — ABNORMAL LOW (ref 135–145)

## 2019-04-10 LAB — TYPE AND SCREEN
ABO/RH(D): O NEG
Antibody Screen: NEGATIVE

## 2019-04-10 LAB — CBC
HCT: 25.6 % — ABNORMAL LOW (ref 39.0–52.0)
Hemoglobin: 8.4 g/dL — ABNORMAL LOW (ref 13.0–17.0)
MCH: 30.8 pg (ref 26.0–34.0)
MCHC: 32.8 g/dL (ref 30.0–36.0)
MCV: 93.8 fL (ref 80.0–100.0)
Platelets: 603 10*3/uL — ABNORMAL HIGH (ref 150–400)
RBC: 2.73 MIL/uL — ABNORMAL LOW (ref 4.22–5.81)
RDW: 14.1 % (ref 11.5–15.5)
WBC: 7.9 10*3/uL (ref 4.0–10.5)
nRBC: 0 % (ref 0.0–0.2)

## 2019-04-10 LAB — PROTIME-INR
INR: 10.1 (ref 0.8–1.2)
Prothrombin Time: 78.7 seconds — ABNORMAL HIGH (ref 11.4–15.2)

## 2019-04-10 LAB — SARS CORONAVIRUS 2 BY RT PCR (HOSPITAL ORDER, PERFORMED IN ~~LOC~~ HOSPITAL LAB): SARS Coronavirus 2: NEGATIVE

## 2019-04-10 MED ORDER — LEVOTHYROXINE SODIUM 50 MCG PO TABS
75.0000 ug | ORAL_TABLET | Freq: Every day | ORAL | Status: DC
  Administered 2019-04-11 – 2019-04-12 (×2): 75 ug via ORAL
  Filled 2019-04-10 (×2): qty 2

## 2019-04-10 MED ORDER — FLUTICASONE-UMECLIDIN-VILANT 100-62.5-25 MCG/INH IN AEPB: 1.0000 | INHALATION_SPRAY | Freq: Every day | RESPIRATORY_TRACT | Status: DC

## 2019-04-10 MED ORDER — UMECLIDINIUM BROMIDE 62.5 MCG/INH IN AEPB
1.0000 | INHALATION_SPRAY | Freq: Every day | RESPIRATORY_TRACT | Status: DC
  Filled 2019-04-10: qty 7

## 2019-04-10 MED ORDER — DOCUSATE SODIUM 100 MG PO CAPS
100.0000 mg | ORAL_CAPSULE | Freq: Two times a day (BID) | ORAL | Status: DC
  Administered 2019-04-10 – 2019-04-12 (×4): 100 mg via ORAL
  Filled 2019-04-10 (×4): qty 1

## 2019-04-10 MED ORDER — TRAZODONE HCL 50 MG PO TABS
25.0000 mg | ORAL_TABLET | Freq: Every evening | ORAL | Status: DC | PRN
  Filled 2019-04-10: qty 1

## 2019-04-10 MED ORDER — ONDANSETRON HCL 4 MG/2ML IJ SOLN
4.0000 mg | Freq: Once | INTRAMUSCULAR | Status: AC
  Administered 2019-04-10: 15:00:00 4 mg via INTRAVENOUS
  Filled 2019-04-10: qty 2

## 2019-04-10 MED ORDER — VITAMIN B-12 1000 MCG PO TABS
1000.0000 ug | ORAL_TABLET | Freq: Every day | ORAL | Status: DC
  Administered 2019-04-10 – 2019-04-12 (×3): 1000 ug via ORAL
  Filled 2019-04-10 (×3): qty 1

## 2019-04-10 MED ORDER — FLUTICASONE FUROATE-VILANTEROL 100-25 MCG/INH IN AEPB
1.0000 | INHALATION_SPRAY | Freq: Every day | RESPIRATORY_TRACT | Status: DC
  Filled 2019-04-10: qty 28

## 2019-04-10 MED ORDER — ACETAMINOPHEN 650 MG RE SUPP: 650.0000 mg | Freq: Four times a day (QID) | RECTAL | Status: DC | PRN

## 2019-04-10 MED ORDER — FOLIC ACID 1 MG PO TABS
1.0000 mg | ORAL_TABLET | Freq: Every day | ORAL | Status: DC
  Administered 2019-04-10 – 2019-04-12 (×3): 1 mg via ORAL
  Filled 2019-04-10 (×3): qty 1

## 2019-04-10 MED ORDER — ONDANSETRON HCL 4 MG PO TABS: 4.0000 mg | ORAL_TABLET | Freq: Four times a day (QID) | ORAL | Status: DC | PRN

## 2019-04-10 MED ORDER — ALLOPURINOL 100 MG PO TABS
100.0000 mg | ORAL_TABLET | Freq: Every day | ORAL | Status: DC
  Administered 2019-04-11 – 2019-04-12 (×2): 100 mg via ORAL
  Filled 2019-04-10 (×3): qty 1

## 2019-04-10 MED ORDER — ACETAMINOPHEN 325 MG PO TABS
650.0000 mg | ORAL_TABLET | Freq: Four times a day (QID) | ORAL | Status: DC | PRN
  Administered 2019-04-10 – 2019-04-11 (×3): 650 mg via ORAL
  Filled 2019-04-10 (×3): qty 2

## 2019-04-10 MED ORDER — ONDANSETRON HCL 4 MG/2ML IJ SOLN: 4.0000 mg | Freq: Four times a day (QID) | INTRAMUSCULAR | Status: DC | PRN

## 2019-04-10 MED ORDER — SODIUM CHLORIDE 0.9 % IV SOLN: 10.0000 mL/h | Freq: Once | INTRAVENOUS | Status: DC

## 2019-04-10 MED ORDER — BISACODYL 5 MG PO TBEC: 5.0000 mg | DELAYED_RELEASE_TABLET | Freq: Every day | ORAL | Status: DC | PRN

## 2019-04-10 MED ORDER — VITAMIN K1 10 MG/ML IJ SOLN
2.5000 mg | Freq: Once | INTRAVENOUS | Status: AC
  Administered 2019-04-10: 2.5 mg via INTRAVENOUS
  Filled 2019-04-10: qty 0.25

## 2019-04-10 MED ORDER — ALBUTEROL SULFATE (2.5 MG/3ML) 0.083% IN NEBU: 3.0000 mL | INHALATION_SOLUTION | RESPIRATORY_TRACT | Status: DC | PRN

## 2019-04-10 MED ORDER — HYDROCODONE-ACETAMINOPHEN 5-325 MG PO TABS: 1.0000 | ORAL_TABLET | ORAL | Status: DC | PRN

## 2019-04-10 NOTE — ED Provider Notes (Signed)
[facility] Emergency Department Provider Note    First MD Initiated Contact with Patient 04/10/19 1423     (approximate)  I have reviewed the triage vital signs and the nursing notes.   HISTORY  Chief Complaint Abnormal Labs    HPI Tony Hinton is a 78 y.o. male presents to the ER for evaluation of abnormal blood work.  Patient is supposed to be on Coumadin for history of factor V Leiden.  States he has had a few episodes of nosebleeding and some easy bruising and noticed some melena.  Had blood work checked and was supratherapeutic on his Coumadin to 15.  Was noted to be with acute blood loss anemia and was sent to the ER.  He denies any pain but is complaining of some nausea.  Denies any head injury.  Denies any numbness or tingling.    Past Medical History:  Diagnosis Date  . Arthritis   . Asthma   . 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 Factor 5 Leiden mutation, heterozygous ([place])   . Thyroid disease    No family history on file. Past Surgical History:  Procedure Laterality Date  . APPENDECTOMY    . COLON SURGERY  09/01/2014  . COLONOSCOPY  2015  . KNEE SURGERY Right   . SHOULDER SURGERY Left    Patient Active Problem List   Diagnosis Date Noted  . Enlarged prostate with urinary retention 09/08/2014  . Small bowel obstruction due to adhesions ([place]) 09/08/2014      Prior to Admission medications   Medication Sig Start Date End Date Taking? Authorizing Provider  allopurinol (ZYLOPRIM) 100 MG tablet Take 100 mg by mouth daily. 09/05/14  Yes [provider]  cholecalciferol (DELTA D3) 10 MCG (400 UNIT) TABS tablet Take 1 tablet by mouth daily.   Yes [provider]  Fluticasone-Umeclidin-Vilant 100-62.5-25 MCG/INH AEPB Inhale 1 puff into the lungs daily. 10/04/18  Yes [provider]  folic acid (FOLVITE) 1 MG tablet Take 1 mg by mouth daily.  07/24/14  Yes [provider]  HYDROcodone-acetaminophen  (NORCO/VICODIN) 5-325 MG tablet Take 1 tablet by mouth every 8 (eight) hours as needed for pain. 04/05/19  Yes [provider]  levothyroxine (SYNTHROID) 75 MCG tablet Take 75 mcg by mouth daily. Take on an empty stomach with a glass of water at least 30-60 minutes before breakfast 05/14/18 05/14/19 Yes [provider]  PROVENTIL HFA 108 (90 BASE) MCG/ACT inhaler Inhale 2 puffs into the lungs every 4 (four) hours as needed.  07/25/14  Yes [provider]  vitamin B-12 (CYANOCOBALAMIN) 1000 MCG tablet Take 1,000 mcg by mouth daily.   Yes [provider]  warfarin (COUMADIN) 2.5 MG tablet Take 2.5 mg by mouth daily. 04/18/16  Yes [provider]  warfarin (COUMADIN) 7.5 MG tablet Take 7.5 mg by mouth daily at 6 PM.  07/24/14   [provider]    Allergies Penicillins, Sulfa antibiotics, and Shellfish allergy    Social History Social History   Tobacco Use  . Smoking status: Never Smoker  . Smokeless tobacco: Never Used  Substance Use Topics  . Alcohol use: Yes    Alcohol/week: 0.0 standard drinks  . Drug use: No    Review of Systems Patient denies headaches, rhinorrhea, blurry vision, numbness, shortness of breath, chest pain, edema, cough, abdominal pain, nausea, vomiting, diarrhea, dysuria, fevers, rashes or hallucinations unless otherwise stated above in HPI. ____________________________________________   PHYSICAL EXAM:  VITAL  SIGNS: Vitals:   04/10/19 1038 04/10/19 1500  BP: 126/66 (!) 153/98  Pulse: 98 81  Resp: 18 14  Temp: 98.1 F (36.7 C)   SpO2: 95% 92%    Constitutional: Alert and oriented.  Pleasant but vomiting on my initial exam. Eyes: Conjunctivae are normal.  Head: Atraumatic. Nose: No congestion/rhinnorhea. Mouth/Throat: Mucous membranes are moist.   Neck: No stridor. Painless ROM.  Cardiovascular: Normal rate, regular rhythm. Grossly normal heart sounds.  Good peripheral circulation. Respiratory: Normal  respiratory effort.  No retractions. Lungs CTAB. Gastrointestinal: Soft and nontender. No distention. No abdominal bruits. No CVA tenderness. Genitourinary:  Musculoskeletal: Large hematoma and swelling to the left shoulder with proximal bruising to right upper extremity as well.  Not warm.  Compartment soft.  No lower extremity tenderness nor edema.  No joint effusions. Neurologic:  Normal speech and language. No gross focal neurologic deficits are appreciated. No facial droop Skin:  Skin is warm, dry and intact. No rash noted. Psychiatric: Mood and affect are normal. Speech and behavior are normal.  ____________________________________________   LABS (all labs ordered are listed, but only abnormal results are displayed)  Results for orders placed or performed during the hospital encounter of 04/10/19 (from the past 24 hour(s))  Basic metabolic panel     Status: Abnormal   Collection Time: 04/10/19 10:43 AM  Result Value Ref Range   Sodium 134 (L) 135 - 145 mmol/L   Potassium 4.2 3.5 - 5.1 mmol/L   Chloride 98 98 - 111 mmol/L   CO2 25 22 - 32 mmol/L   Glucose, Bld 170 (H) 70 - 99 mg/dL   BUN 42 (H) 8 - 23 mg/dL   Creatinine, Ser 1.03 0.61 - 1.24 mg/dL   Calcium 9.0 8.9 - 10.3 mg/dL   GFR calc non Af Amer >60 >60 mL/min   GFR calc Af Amer >60 >60 mL/min   Anion gap 11 5 - 15  CBC     Status: Abnormal   Collection Time: 04/10/19 10:43 AM  Result Value Ref Range   WBC 7.9 4.0 - 10.5 K/uL   RBC 2.73 (L) 4.22 - 5.81 MIL/uL   Hemoglobin 8.4 (L) 13.0 - 17.0 g/dL   HCT 25.6 (L) 39.0 - 52.0 %   MCV 93.8 80.0 - 100.0 fL   MCH 30.8 26.0 - 34.0 pg   MCHC 32.8 30.0 - 36.0 g/dL   RDW 14.1 11.5 - 15.5 %   Platelets 603 (H) 150 - 400 K/uL   nRBC 0.0 0.0 - 0.2 %  Protime-INR     Status: Abnormal   Collection Time: 04/10/19 10:43 AM  Result Value Ref Range   Prothrombin Time 78.7 (H) 11.4 - 15.2 seconds   INR 10.1 (HH) 0.8 - 1.2  Type and screen [facility]      Status: None (Preliminary result)   Collection Time: 04/10/19  2:37 PM  Result Value Ref Range   ABO/RH(D) PENDING    Antibody Screen PENDING    Sample Expiration      04/13/2019,2359 Performed at [facility], [address], [place], [place] [address]    ____________________________________________  EKG My review and personal interpretation at Time:  10:39  Indication: abnormal lab  Rate: 95  Rhythm: sinus Axis: normal Other: nonspecific t wave changes, non specific st abn ____________________________________________  RADIOLOGY  I personally reviewed all radiographic images ordered to evaluate for the above acute complaints and reviewed radiology reports and findings.  These findings  were personally discussed with the patient.  Please see medical record for radiology report.  ____________________________________________   PROCEDURES  Procedure(s) performed:  .Critical Care Performed by: Merlyn Lot, MD Authorized by: Merlyn Lot, MD   Critical care provider statement:    Critical care time (minutes):  30   Critical care time was exclusive of:  Separately billable procedures and treating other patients   Critical care was necessary to treat or prevent imminent or life-threatening deterioration of the following conditions:  Circulatory failure   Critical care was time spent personally by me on the following activities:  Development of treatment plan with patient or surrogate, discussions with consultants, evaluation of patient's response to treatment, examination of patient, obtaining history from patient or surrogate, ordering and performing treatments and interventions, ordering and review of laboratory studies, ordering and review of radiographic studies, pulse oximetry, re-evaluation of patient's condition and review of old charts      Critical Care performed: yes ____________________________________________   INITIAL IMPRESSION / [place] / ED COURSE  Pertinent labs & imaging results that were available during my care of the patient were reviewed by me and considered in my medical decision making (see chart for details).   DDX: Supratherapeutic INR, acute blood loss anemia, DIC, GI bleed, hematoma medication noncompliance  Tony Hinton is a 78 y.o. who presents to the ED with symptoms as described above.  Patient protecting his airways not with any evidence of active massive GI bleeding but certainly has multiple sources from which he has been losing blood and which would explain his low hemoglobin particularly in the setting of supratherapeutic INR.  With his vomiting I will order stat CT head to exclude head bleed.  Will order vitamin K.  Patient will be typed and screened.  Clinical Course as of Apr 09 1648  Wed Apr 10, 2019  1630 CT head without any evidence of bleed.  He reminds hemodynamically stable.  Will give FFP and will discuss with hospitalist for admission.   [PR]    Clinical Course User Index [PR] Merlyn Lot, MD    The patient was evaluated in Emergency Department today for the symptoms described in the history of present illness. He/she was evaluated in the context of the global COVID-19 pandemic, which necessitated consideration that the patient might be at risk for infection with the SARS-CoV-2 virus that causes COVID-19. Institutional protocols and algorithms that pertain to the evaluation of patients at risk for COVID-19 are in a state of rapid change based on information released by regulatory bodies including the CDC and federal and state organizations. These policies and algorithms were followed during the patient's care in the ED.  As part of my medical decision making, I reviewed the following data within the [place] notes reviewed and incorporated, Labs reviewed, notes from prior ED visits and [place] Controlled Substance Database    ____________________________________________   FINAL CLINICAL IMPRESSION(S) / ED DIAGNOSES  Final diagnoses:  Acute blood loss anemia  Supratherapeutic INR      NEW MEDICATIONS STARTED DURING THIS VISIT:  New Prescriptions   No medications on file     Note:  This document was prepared using Dragon voice recognition software and may include unintentional dictation errors.    Merlyn Lot, MD 04/10/19 [phone]

## 2019-04-10 NOTE — ED Notes (Signed)
ED TO INPATIENT HANDOFF REPORT  ED Nurse Name and Phone #:  Anson Crofts Name/Age/Gender Tony Hinton 78 y.o. male Room/Bed: ED17A/ED17A  Code Status   Code Status: Full Code  Home/SNF/Other Home Patient oriented to: self, place, time and situation Is this baseline? Yes   Triage Complete: Triage complete  Chief Complaint abnormal labs  Triage Note Pt sent form Dr. Suzan Nailer for elevated INR, staes it was >10, yesterday it was 15 and is not able to get it under control, pt has hematoma to the left shoulder. States he had a nose bleed yesterday and is having some today.    Allergies Allergies  Allergen Reactions  . Penicillins   . Sulfa Antibiotics Other (See Comments)    Effects eyes  . Shellfish Allergy Other (See Comments) and Rash    Effects his eyes    Level of Care/Admitting Diagnosis ED Disposition    ED Disposition Condition [place] [facility] Area: [place] [100120]  Level of Care: Med-Surg [16]  Covid Evaluation: Asymptomatic Screening Protocol (No Symptoms)  Diagnosis: Supratherapeutic INR [616073]  Admitting Physician: Epifanio Lesches [[id number]]  Attending Physician: Epifanio Lesches [phone]  PT Class (Do Not Modify): Observation [104]  PT Acc Code (Do Not Modify): Observation [10022]       B Medical/Surgery History Past Medical History:  Diagnosis Date  . Arthritis   . Asthma   . 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 Factor 5 Leiden mutation, heterozygous ([place])   . Thyroid disease    Past Surgical History:  Procedure Laterality Date  . APPENDECTOMY    . COLON SURGERY  09/01/2014  . COLONOSCOPY  2015  . KNEE SURGERY Right   . SHOULDER SURGERY Left      A IV Location/Drains/Wounds Patient Lines/Drains/Airways Status   Active Line/Drains/Airways    Name:   Placement date:   Placement time:   Site:   Days:   Peripheral IV 04/10/19 Right Forearm   04/10/19    1439    Forearm   less than 1           Intake/Output Last 24 hours  Intake/Output Summary (Last 24 hours) at 04/10/2019 2136 Last data filed at 04/10/2019 2022 Gross per 24 hour  Intake 650 ml  Output -  Net 650 ml    Labs/Imaging Results for orders placed or performed during the hospital encounter of 04/10/19 (from the past 48 hour(s))  Basic metabolic panel     Status: Abnormal   Collection Time: 04/10/19 10:43 AM  Result Value Ref Range   Sodium 134 (L) 135 - 145 mmol/L   Potassium 4.2 3.5 - 5.1 mmol/L   Chloride 98 98 - 111 mmol/L   CO2 25 22 - 32 mmol/L   Glucose, Bld 170 (H) 70 - 99 mg/dL   BUN 42 (H) 8 - 23 mg/dL   Creatinine, Ser 1.03 0.61 - 1.24 mg/dL   Calcium 9.0 8.9 - 10.3 mg/dL   GFR calc non Af Amer >60 >60 mL/min   GFR calc Af Amer >60 >60 mL/min   Anion gap 11 5 - 15    Comment: Performed at [facility], [address] [place], [place] [address]  CBC     Status: Abnormal   Collection Time: 04/10/19 10:43 AM  Result Value Ref Range   WBC 7.9 4.0 - 10.5 K/uL   RBC 2.73 (L) 4.22 - 5.81 MIL/uL   Hemoglobin 8.4 (L) 13.0 -  17.0 g/dL   HCT 25.6 (L) 39.0 - 52.0 %   MCV 93.8 80.0 - 100.0 fL   MCH 30.8 26.0 - 34.0 pg   MCHC 32.8 30.0 - 36.0 g/dL   RDW 14.1 11.5 - 15.5 %   Platelets 603 (H) 150 - 400 K/uL   nRBC 0.0 0.0 - 0.2 %    Comment: Performed at [facility], [address] [place], [place] [address]  Protime-INR     Status: Abnormal   Collection Time: 04/10/19 10:43 AM  Result Value Ref Range   Prothrombin Time 78.7 (H) 11.4 - 15.2 seconds   INR 10.1 (HH) 0.8 - 1.2    Comment: CRITICAL RESULT CALLED TO, READ BACK BY AND VERIFIED WITH: STEPHANIE RUDD AT 1132 ON 04/10/2019 JJB (NOTE) INR goal varies based on device and disease states. Performed at [facility], [address] [place], [place] [address]   Type and screen [place]     Status: None (Preliminary result)   Collection Time: 04/10/19  2:37 PM  Result Value Ref Range    ABO/RH(D) PENDING    Antibody Screen PENDING    Sample Expiration      04/13/2019,2359 Performed at  Hospital Lab, [address], [place], [place] [address]   SARS Coronavirus 2 (CEPHEID- Performed in [facility] hospital lab), Hosp Order     Status: None   Collection Time: 04/10/19  3:58 PM   Specimen: Nasopharyngeal Swab  Result Value Ref Range   SARS Coronavirus 2 NEGATIVE NEGATIVE    Comment: (NOTE) If result is NEGATIVE SARS-CoV-2 target nucleic acids are NOT DETECTED. The SARS-CoV-2 RNA is generally detectable in upper and lower  respiratory specimens during the acute phase of infection. The lowest  concentration of SARS-CoV-2 viral copies this assay can detect is 250  copies / mL. A negative result does not preclude SARS-CoV-2 infection  and should not be used as the sole basis for treatment or other  patient management decisions.  A negative result may occur with  improper specimen collection / handling, submission of specimen other  than nasopharyngeal swab, presence of viral mutation(s) within the  areas targeted by this assay, and inadequate number of viral copies  (<250 copies / mL). A negative result must be combined with clinical  observations, patient history, and epidemiological information. If result is POSITIVE SARS-CoV-2 target nucleic acids are DETECTED. The SARS-CoV-2 RNA is generally detectable in upper and lower  respiratory specimens dur ing the acute phase of infection.  Positive  results are indicative of active infection with SARS-CoV-2.  Clinical  correlation with patient history and other diagnostic information is  necessary to determine patient infection status.  Positive results do  not rule out bacterial infection or co-infection with other viruses. If result is PRESUMPTIVE POSTIVE SARS-CoV-2 nucleic acids MAY BE PRESENT.   A presumptive positive result was obtained on the submitted specimen  and confirmed on repeat testing.  While 2019  novel coronavirus  (SARS-CoV-2) nucleic acids may be present in the submitted sample  additional confirmatory testing may be necessary for epidemiological  and / or clinical management purposes  to differentiate between  SARS-CoV-2 and other Sarbecovirus currently known to infect humans.  If clinically indicated additional testing with an alternate test  methodology [phone]) is advised. The SARS-CoV-2 RNA is generally  detectable in upper and lower respiratory sp ecimens during the acute  phase of infection. The expected result is Negative. Fact Sheet for Patients:  StrictlyIdeas.no Fact Sheet for Healthcare Providers: BankingDealers.co.za This test is not yet approved or cleared by the [place] FDA and has been authorized for detection and/or diagnosis of SARS-CoV-2 by FDA under an Emergency Use Authorization (EUA).  This EUA will remain in effect (meaning this test can be used) for the duration of the COVID-19 declaration under Section 564(b)(1) of the Act, 21 U.S.C. section 360bbb-3(b)(1), unless the authorization is terminated or revoked sooner. Performed at [facility], [address] [place], [place] [address]   Prepare fresh frozen plasma     Status: None (Preliminary result)   Collection Time: 04/10/19  4:11 PM  Result Value Ref Range   Unit Number [id number]    Blood Component Type THAWED PLASMA    Unit division 00    Status of Unit ISSUED    Transfusion Status      OK TO TRANSFUSE Performed at [facility], [address] [place], [place] [address]   Type and screen     Status: None   Collection Time: 04/10/19  6:23 PM  Result Value Ref Range   ABO/RH(D) O NEG    Antibody Screen NEG    Sample Expiration      04/13/2019,2359 Performed at [facility], [address] [place], [place] [address]    Ct Head Wo Contrast  Result Date: 04/10/2019 CLINICAL DATA:  Recent nose bleeds  and elevated INR, altered level of consciousness EXAM: CT HEAD WITHOUT CONTRAST TECHNIQUE: Contiguous axial images were obtained from the base of the skull through the vertex without intravenous contrast. COMPARISON:  None. FINDINGS: Brain: Mild atrophic and chronic white matter ischemic changes are seen. No findings to suggest acute hemorrhage, acute infarction or space-occupying mass lesion are noted. Vascular: No hyperdense vessel or unexpected calcification. Skull: Normal. Negative for fracture or focal lesion. Sinuses/Orbits: No acute finding. Other: None. IMPRESSION: Atrophic and chronic white matter ischemic changes without acute abnormality. Electronically Signed   By: Inez Catalina M.D.   On: 04/10/2019 15:31    Pending Labs Unresulted Labs (From admission, onward)    Start     Ordered   04/11/19 2542  Basic metabolic panel  Tomorrow morning,   STAT     04/10/19 2009   04/11/19 0500  CBC  Tomorrow morning,   STAT     04/10/19 2009          Vitals/Pain Today's Vitals   04/10/19 1845 04/10/19 1930 04/10/19 2000 04/10/19 2015  BP: 132/81 127/79 133/74 127/80  Pulse:   85 84  Resp: 19 20 19 18   Temp:   98.7 F (37.1 C) 98.4 F (36.9 C)  TempSrc:    Oral  SpO2:   94% 93%  Weight:      Height:      PainSc:        Isolation Precautions No active isolations  Medications Medications  0.9 %  sodium chloride infusion (has no administration in time range)  allopurinol (ZYLOPRIM) tablet 100 mg (has no administration in time range)  levothyroxine (SYNTHROID) tablet 75 mcg (has no administration in time range)  folic acid (FOLVITE) tablet 1 mg (has no administration in time range)  vitamin B-12 (CYANOCOBALAMIN) tablet 1,000 mcg (has no administration in time range)  Fluticasone-Umeclidin-Vilant 100-62.5-25 MCG/INH AEPB 1 puff (has no administration in time range)  albuterol (VENTOLIN HFA) 108 (90 Base) MCG/ACT inhaler 2 puff (has no administration in time range)  acetaminophen  (TYLENOL) tablet 650 mg (has no administration  in time range)    Or  acetaminophen (TYLENOL) suppository 650 mg (has no administration in time range)  HYDROcodone-acetaminophen (NORCO/VICODIN) 5-325 MG per tablet 1-2 tablet (has no administration in time range)  traZODone (DESYREL) tablet 25 mg (has no administration in time range)  docusate sodium (COLACE) capsule 100 mg (has no administration in time range)  bisacodyl (DULCOLAX) EC tablet 5 mg (has no administration in time range)  ondansetron (ZOFRAN) tablet 4 mg (has no administration in time range)    Or  ondansetron (ZOFRAN) injection 4 mg (has no administration in time range)  ondansetron (ZOFRAN) injection 4 mg (4 mg Intravenous Given 04/10/19 1444)  phytonadione (VITAMIN K) 2.5 mg in dextrose 5 % 50 mL IVPB (0 mg Intravenous Stopped 04/10/19 1631)    Mobility walks Low fall risk   Focused Assessments Anemia   R Recommendations: See Admitting Provider Note  Report given to:   Additional Notes:

## 2019-04-10 NOTE — H&P (Addendum)
[facility] [place] NAME: Tony Hinton    MR#:  [id number]  DATE OF BIRTH:  03/15/1941  DATE OF ADMISSION:  04/10/2019  PRIMARY CARE PHYSICIAN: Adin Hector, MD   REQUESTING/REFERRING PHYSICIAN: Dr. Merlyn Lot  CHIEF COMPLAINT: Abnormal labs   Chief Complaint  Patient presents with  . Abnormal Labs    HISTORY OF PRESENT ILLNESS:  Tony Hinton  is a 78 y.o. 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  Patient also had episodes of nosebleeds yesterday and day before but none today.  Patient does not know if he had melena or not.  No dysuria or hematuria.  Patient denies any other bleeding except nosebleed that happened the day before and yesterday.  No chest pain, cough, fever, no shortness of breath.  Patient follows up with Dr. Bea Graff, hemoglobin 8.4 today.  Patient blood work check 3 years ago it was 11.9.  PAST MEDICAL HISTORY:   Past Medical History:  Diagnosis Date  . Arthritis   . Asthma   . 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 Factor 5 Leiden mutation, heterozygous ([place])   . Thyroid disease     PAST SURGICAL HISTOIRY:   Past Surgical History:  Procedure Laterality Date  . APPENDECTOMY    . COLON SURGERY  09/01/2014  . COLONOSCOPY  2015  . KNEE SURGERY Right   . SHOULDER SURGERY Left     SOCIAL HISTORY:   Social History   Tobacco Use  . Smoking status: Never Smoker  . Smokeless tobacco: Never Used  Substance Use Topics  . Alcohol use: Yes    Alcohol/week: 0.0 standard drinks    FAMILY HISTORY:  No family history on file.  DRUG ALLERGIES:   Allergies  Allergen Reactions  . Penicillins   . Sulfa Antibiotics Other (See Comments)    Effects eyes  . Shellfish Allergy Other (See Comments) and Rash    Effects his eyes    REVIEW OF SYSTEMS:  CONSTITUTIONAL: No fever,  fatigue or weakness.  EYES: No blurred or double vision.  EARS, NOSE, AND THROAT: No tinnitus or ear pain.  Had nosebleed yesterday, day before but none today. RESPIRATORY: No cough, shortness of breath, wheezing or hemoptysis.  CARDIOVASCULAR: No chest pain, orthopnea, edema.  GASTROINTESTINAL: No nausea, vomiting, diarrhea or abdominal pain.  GENITOURINARY: No dysuria, hematuria.  ENDOCRINE: No polyuria, nocturia,  HEMATOLOGY: No anemia, easy bruising or bleeding SKIN: No rash or lesion. MUSCULOSKELETAL: No joint pain or arthritis.   NEUROLOGIC: No tingling, numbness, weakness.  PSYCHIATRY: No anxiety or depression.   MEDICATIONS AT HOME:   Prior to Admission medications   Medication Sig Start Date End Date Taking? Authorizing Provider  allopurinol (ZYLOPRIM) 100 MG tablet Take 100 mg by mouth daily. 09/05/14  Yes [provider]  cholecalciferol (DELTA D3) 10 MCG (400 UNIT) TABS tablet Take 1 tablet by mouth daily.   Yes [provider]  Fluticasone-Umeclidin-Vilant 100-62.5-25 MCG/INH AEPB Inhale 1 puff into the lungs daily. 10/04/18  Yes [provider]  folic acid (FOLVITE) 1 MG tablet Take 1 mg by mouth daily.  07/24/14  Yes [provider]  HYDROcodone-acetaminophen (NORCO/VICODIN) 5-325 MG tablet Take 1 tablet by mouth every 8 (eight) hours as needed for pain. 04/05/19  Yes [provider]  levothyroxine (SYNTHROID)  75 MCG tablet Take 75 mcg by mouth daily. Take on an empty stomach with a glass of water at least 30-60 minutes before breakfast 05/14/18 05/14/19 Yes [provider]  PROVENTIL HFA 108 (90 BASE) MCG/ACT inhaler Inhale 2 puffs into the lungs every 4 (four) hours as needed.  07/25/14  Yes [provider]  vitamin B-12 (CYANOCOBALAMIN) 1000 MCG tablet Take 1,000 mcg by mouth daily.   Yes [provider]  warfarin (COUMADIN) 2.5 MG tablet Take 2.5 mg by mouth daily. 04/18/16  Yes [provider]   warfarin (COUMADIN) 7.5 MG tablet Take 7.5 mg by mouth daily at 6 PM.  07/24/14   [provider]      VITAL SIGNS:  Blood pressure (!) 153/98, pulse 81, temperature 98.1 F (36.7 C), temperature source Oral, resp. rate 14, height 5\' 7"  (1.702 m), weight 90.7 kg, SpO2 92 %.  PHYSICAL EXAMINATION:  GENERAL:  78 y.o.-year-old patient lying in the bed with no acute distress.  EYES: Pupils equal, round, reactive to light and accommodation. No scleral icterus. Extraocular muscles intact.  HEENT: Head atraumatic, normocephalic. Oropharynx and nasopharynx clear.  NECK:  Supple, no jugular venous distention. No thyroid enlargement, no tenderness.  LUNGS: Normal breath sounds bilaterally, no wheezing, rales,rhonchi or crepitation. No use of accessory muscles of respiration.  CARDIOVASCULAR: S1, S2 normal. No murmurs, rubs, or gallops.  ABDOMEN: Soft, nontender, nondistended. Bowel sounds present. No organomegaly or mass.  EXTREMITIES: No pedal edema, cyanosis, or clubbing.  Patient has BK ecchymosis noted in left arm. NEUROLOGIC: Cranial nerves II through XII are intact. Muscle strength 5/5 in all extremities. Sensation intact. Gait not checked.  PSYCHIATRIC: The patient is alert and oriented x 3.  SKIN: No obvious rash, lesion, or ulcer.   LABORATORY PANEL:   CBC Recent Labs  Lab 04/10/19 1043  WBC 7.9  HGB 8.4*  HCT 25.6*  PLT 603*   ------------------------------------------------------------------------------------------------------------------  Chemistries  Recent Labs  Lab 04/10/19 1043  NA 134*  K 4.2  CL 98  CO2 25  GLUCOSE 170*  BUN 42*  CREATININE 1.03  CALCIUM 9.0   ------------------------------------------------------------------------------------------------------------------  Cardiac Enzymes No results for input(s): TROPONINI in the last 168  hours. ------------------------------------------------------------------------------------------------------------------  RADIOLOGY:  Ct Head Wo Contrast  Result Date: 04/10/2019 CLINICAL DATA:  Recent nose bleeds and elevated INR, altered level of consciousness EXAM: CT HEAD WITHOUT CONTRAST TECHNIQUE: Contiguous axial images were obtained from the base of the skull through the vertex without intravenous contrast. COMPARISON:  None. FINDINGS: Brain: Mild atrophic and chronic white matter ischemic changes are seen. No findings to suggest acute hemorrhage, acute infarction or space-occupying mass lesion are noted. Vascular: No hyperdense vessel or unexpected calcification. Skull: Normal. Negative for fracture or focal lesion. Sinuses/Orbits: No acute finding. Other: None. IMPRESSION: Atrophic and chronic white matter ischemic changes without acute abnormality. Electronically Signed   By: Inez Catalina M.D.   On: 04/10/2019 15:31    EKG:   Orders placed or performed during the hospital encounter of 04/10/19  . EKG 12-Lead  . EKG 12-Lead  . ED EKG  . ED EKG  . EKG 12-Lead  . EKG 12-Lead    IMPRESSION AND PLAN:   78 year old male patient with history of factor V Leiden deficiency on Coumadin for many years comes in because of supratherapeutic INR of 15 in the clinic and 10 on blood work in the hospital, because of episodes of epistaxis that happened with hemoglobin of 8.43 will keep patient  in observation status for overnight monitoring.  Patient denies any other complaints.  Head CAT scan is normal without any hemorrhage. 2.  Acute on chronic anemia, patient hemoglobin 8.4 but do not have any recent hemoglobin to compare, patient's hemoglobin was in 2015 at 11.9.  I do not know whether it is an acute drop or not. 3.  Supratherapeutic INR causing epistaxis, patient received a dose of vitamin K in the emergency room, check INR tomorrow, watch closely for any other signs of bleeding or anemia  requiring blood transfusion.  Otherwise patient can be discharged home tomorrow  #4 hypothyroidism: Continue Synthyroid. 4.  History of COPD, continue home dose inhalers, no wheezing.   Or Shortness of breath. #5 factor V Leiden deficiency, patient takes Coumadin for long time because of.  Never had problems with INR.  Recently had to take pain medicine for shoulder pain and in the process of getting her blood repair done. All the records are reviewed and case discussed with ED provider. Management plans discussed with the patient, family and they are in agreement.  CODE STATUS: full  TOTAL TIME TAKING CARE OF THIS PATIENT: 55 minutes.    Epifanio Lesches M.D on 04/10/2019 at 5:17 PM  Between 7am to 6pm - Pager - [phone]  After 6pm go to [email or web address] - password [email or web address] [facility]  Office  [phone]  CC: Primary care physician; Adin Hector, MD  Note: This dictation was prepared with Dragon dictation along with smaller phrase technology. Any transcriptional errors that result from this process are unintentional.

## 2019-04-10 NOTE — ED Notes (Signed)
Patient transported to CT 

## 2019-04-10 NOTE — ED Triage Notes (Signed)
Pt sent form Dr. Suzan Nailer for elevated INR, staes it was >10, yesterday it was 15 and is not able to get it under control, pt has hematoma to the left shoulder. States he had a nose bleed yesterday and is having some today.

## 2019-04-11 DIAGNOSIS — R040 Epistaxis: Secondary | ICD-10-CM

## 2019-04-11 DIAGNOSIS — D62 Acute posthemorrhagic anemia: Secondary | ICD-10-CM

## 2019-04-11 LAB — PROTIME-INR
INR: 1.6 — ABNORMAL HIGH (ref 0.8–1.2)
INR: 1.6 — ABNORMAL HIGH (ref 0.8–1.2)
Prothrombin Time: 18.8 seconds — ABNORMAL HIGH (ref 11.4–15.2)
Prothrombin Time: 18.5 seconds — ABNORMAL HIGH (ref 11.4–15.2)

## 2019-04-11 LAB — CBC
HCT: 22.9 % — ABNORMAL LOW (ref 39.0–52.0)
Hemoglobin: 7.2 g/dL — ABNORMAL LOW (ref 13.0–17.0)
MCH: 29.8 pg (ref 26.0–34.0)
MCHC: 31.4 g/dL (ref 30.0–36.0)
MCV: 94.6 fL (ref 80.0–100.0)
Platelets: 513 10*3/uL — ABNORMAL HIGH (ref 150–400)
RBC: 2.42 MIL/uL — ABNORMAL LOW (ref 4.22–5.81)
RDW: 14.4 % (ref 11.5–15.5)
WBC: 5.7 10*3/uL (ref 4.0–10.5)
nRBC: 0 % (ref 0.0–0.2)

## 2019-04-11 LAB — RETICULOCYTES
Immature Retic Fract: 30.7 % — ABNORMAL HIGH (ref 2.3–15.9)
RBC.: 2.75 MIL/uL — ABNORMAL LOW (ref 4.22–5.81)
Retic Count, Absolute: 120.5 10*3/uL (ref 19.0–186.0)
Retic Ct Pct: 4.4 % — ABNORMAL HIGH (ref 0.4–3.1)

## 2019-04-11 LAB — IRON AND TIBC
Iron: 45 ug/dL (ref 45–182)
Saturation Ratios: 22 % (ref 17.9–39.5)
TIBC: 202 ug/dL — ABNORMAL LOW (ref 250–450)
UIBC: 157 ug/dL

## 2019-04-11 LAB — BPAM FFP
Blood Product Expiration Date: 202007272359
ISSUE DATE / TIME: 202007222000
Unit Type and Rh: 5100

## 2019-04-11 LAB — PREPARE FRESH FROZEN PLASMA: Unit division: 0

## 2019-04-11 LAB — GLUCOSE, CAPILLARY: Glucose-Capillary: 96 mg/dL (ref 70–99)

## 2019-04-11 LAB — BASIC METABOLIC PANEL
Anion gap: 6 (ref 5–15)
BUN: 33 mg/dL — ABNORMAL HIGH (ref 8–23)
CO2: 28 mmol/L (ref 22–32)
Calcium: 8.5 mg/dL — ABNORMAL LOW (ref 8.9–10.3)
Chloride: 102 mmol/L (ref 98–111)
Creatinine, Ser: 0.78 mg/dL (ref 0.61–1.24)
GFR calc Af Amer: >60 mL/min (ref >60)
GFR calc non Af Amer: >60 mL/min (ref >60)
Glucose, Bld: 112 mg/dL — ABNORMAL HIGH (ref 70–99)
Potassium: 3.8 mmol/L (ref 3.5–5.1)
Sodium: 136 mmol/L (ref 135–145)

## 2019-04-11 LAB — VITAMIN B12: Vitamin B-12: 3283 pg/mL — ABNORMAL HIGH (ref 180–914)

## 2019-04-11 LAB — FOLATE: Folate: 26 ng/mL (ref >5.9)

## 2019-04-11 LAB — FERRITIN: Ferritin: 1158 ng/mL — ABNORMAL HIGH (ref 24–336)

## 2019-04-11 MED ORDER — WARFARIN SODIUM 7.5 MG PO TABS
7.5000 mg | ORAL_TABLET | Freq: Once | ORAL | Status: AC
  Administered 2019-04-11: 17:00:00 7.5 mg via ORAL
  Filled 2019-04-11: qty 1

## 2019-04-11 MED ORDER — WARFARIN - PHARMACIST DOSING INPATIENT: Freq: Every day | Status: DC

## 2019-04-11 NOTE — Progress Notes (Signed)
   04/11/19 1200  Clinical Encounter Type  Visited With Patient  Visit Type Initial  Referral From Nurse  Spiritual Encounters  Spiritual Needs Other (Comment) (Advance Directives Education)  Ch received an OR for AD education. Pt wants his wife to be his HCPOA. Ch educated the pt and informed him how to reach a ch when he wants to finalize the document. Ch will follow up when he is ready to complete the document.

## 2019-04-11 NOTE — Progress Notes (Signed)
ANTICOAGULATION CONSULT NOTE - Initial Consult  Pharmacy Consult for Warfarin Indication: Factor V Leiden deficiency  Allergies  Allergen Reactions  . Penicillins   . Sulfa Antibiotics Other (See Comments)    Effects eyes  . Shellfish Allergy Other (See Comments) and Rash    Effects his eyes    Patient Measurements: Height: 5\' 7"  (170.2 cm) Weight: 176 lb 9.6 oz (80.1 kg) IBW/kg (Calculated) : 66.1 Heparin Dosing Weight:    Vital Signs: Temp: 97.7 F (36.5 C) (07/23 0810) Temp Source: Oral (07/23 0810) BP: 107/74 (07/23 0810) Pulse Rate: 76 (07/23 0810)  Labs: Recent Labs    04/10/19 1043 04/11/19 0347 04/11/19 0828 04/11/19 1019  HGB 8.4* 7.2*  --   --   HCT 25.6* 22.9*  --   --   PLT 603* 513*  --   --   LABPROT 78.7*  --  18.8* 18.5*  INR 10.1*  --  1.6* 1.6*  CREATININE 1.03 0.78  --   --     Estimated Creatinine Clearance: 79.7 mL/min (by C-G formula based on SCr of 0.78 mg/dL).   Medical History: Past Medical History:  Diagnosis Date  . Arthritis   . Asthma   . 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 Factor 5 Leiden mutation, heterozygous ([place])   . Thyroid disease     Medications:  Scheduled:  . allopurinol  100 mg Oral Daily  . docusate sodium  100 mg Oral BID  . fluticasone furoate-vilanterol  1 puff Inhalation Daily   And  . umeclidinium bromide  1 puff Inhalation Daily  . folic acid  1 mg Oral Daily  . levothyroxine  75 mcg Oral Q0600  . vitamin B-12  1,000 mcg Oral Daily  . warfarin  7.5 mg Oral ONCE-1800  . Warfarin - Pharmacist Dosing Inpatient   Does not apply q1800   Infusions:  . sodium chloride      Assessment: 78 yo M to be resumed on Warfarin for Factor V Leiden deficiency. Pt admitted with hematoma/epistaxis+ elevated INR of 10.1.  Patient was given Vit K 2.5 mg IV x1 on 7/22.  Home dose:  Warfarin  8.5 mg daily per note from 04/05/2019.   Date INR Dose 7/22 10.1 none 7/23 1.6  Goal of Therapy:  INR 2-3 Monitor platelets by  anticoagulation protocol: Yes   Plan:  Epistaxis resolved Will resume Warfarin at a slightly lower dose of 7.5 mg x 1 tonight and assess. Patient received Vit K which may affect the time it takes to get back to a therapeutic INR. F/u INR in am and CBC every 3 days per protocol. Hgb 7.2 Plt 513  Ronda Rajkumar A 04/11/2019,12:57 PM

## 2019-04-11 NOTE — Progress Notes (Signed)
Family Meeting Note  Advance Directive:no  Today a meeting took place with the Patient.   The following clinical team members were present during this meeting:MD  The following were discussed:Patient's diagnosis: Anemia with factor V Leiden and a supratherapeutic INR, Patient's progosis: > 12 months and Goals for treatment: DNR  Additional follow-up to be provided: Chaplain consult for advanced directives and DNR ordered in chart  Time spent during discussion:16 MINUTES  Tony Costa, MD

## 2019-04-11 NOTE — Consult Note (Signed)
Tony Hinton , MD [address], [place], [place], [place], [address] [address], [place], [place], [place], [address] Phone: [phone]  Fax: [phone]  Consultation  Referring Provider:  DR Tony Hinton  Primary Care Physician:  Tony Hector, MD Primary Gastroenterologist:  None          Reason for Consultation:     Anemia   Date of Admission:  04/10/2019 Date of Consultation:  04/11/2019         HPI:   Tony Hinton is a 78 y.o. male has a history of Leyden 5 mutation on Coumadin was found to have an INR of 15 and hence was admitted.  In addition had some episodes of nosebleeds.  He says he has had profuse nose bleeds for the past few days , would occur each evening , last episode was yesterday , since the nose bleeds began has been having dark colored stools, denies any nsaid use and no abdominal pain. Last colonoscopy was many years back.    Hemoglobin is 8.5 g on admission with an MCV of 93.8 and a platelet count of 603.  On 03/21/2019 hemoglobin was 13.9 g.  Today hemoglobin is 7.2 g with an MCV of 94 and a platelet count of 530.  INR dropped to 1.6 today iron studies B12 and folate are in process.  Past Medical History:  Diagnosis Date  . Arthritis   . Asthma   . 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 Factor 5 Leiden mutation, heterozygous ([place])   . Thyroid disease     Past Surgical History:  Procedure Laterality Date  . APPENDECTOMY    . COLON SURGERY  09/01/2014  . COLONOSCOPY  2015  . KNEE SURGERY Right   . SHOULDER SURGERY Left     Prior to Admission medications   Medication Sig Start Date End Date Taking? Authorizing Provider  allopurinol (ZYLOPRIM) 100 MG tablet Take 100 mg by mouth daily. 09/05/14  Yes [provider]  cholecalciferol (DELTA D3) 10 MCG (400 UNIT) TABS tablet Take 1 tablet by mouth daily.   Yes [provider]  Fluticasone-Umeclidin-Vilant 100-62.5-25 MCG/INH AEPB Inhale 1 puff into the lungs daily. 10/04/18  Yes [provider]  folic acid (FOLVITE) 1 MG tablet Take 1 mg by mouth daily.  07/24/14  Yes [provider]  HYDROcodone-acetaminophen (NORCO/VICODIN) 5-325 MG tablet Take 1 tablet by mouth every 8 (eight) hours as needed for pain. 04/05/19  Yes [provider]  levothyroxine (SYNTHROID) 75 MCG tablet Take 75 mcg by mouth daily. Take on an empty stomach with a glass of water at least 30-60 minutes before breakfast 05/14/18 05/14/19 Yes [provider]  PROVENTIL HFA 108 (90 BASE) MCG/ACT inhaler Inhale 2 puffs into the lungs every 4 (four) hours as needed.  07/25/14  Yes [provider]  vitamin B-12 (CYANOCOBALAMIN) 1000 MCG tablet Take 1,000 mcg by mouth daily.   Yes [provider]  warfarin (COUMADIN) 2.5 MG tablet Take 2.5 mg by mouth daily. 04/18/16  Yes [provider]  warfarin (COUMADIN) 7.5 MG tablet Take 7.5 mg by mouth daily at 6 PM.  07/24/14   [provider]    History reviewed. No pertinent family history.   Social History   Tobacco Use  . Smoking status: Never Smoker  . Smokeless tobacco: Never Used  Substance Use Topics  . Alcohol use: Yes    Alcohol/week: 0.0 standard drinks  . Drug use: No  Allergies as of 04/10/2019 - Review Complete 04/10/2019  Allergen Reaction Noted  . Penicillins  09/08/2014  . Sulfa antibiotics Other (See Comments) 09/08/2014  . Shellfish allergy Other (See Comments) and Rash 09/08/2014    Review of Systems:    All systems reviewed and negative except where noted in HPI.   Physical Exam:  Vital signs in last 24 hours: Temp:  [97.7 F (36.5 C)-98.7 F (37.1 C)] 97.7 F (36.5 C) (07/23 0810) Pulse Rate:  [76-98] 76 (07/23 0810) Resp:  [14-22] 17 (07/23 0810) BP: (107-153)/(62-98) 107/74 (07/23 0810) SpO2:  [92 %-100 %] 94 % (07/23 0810) Weight:  [80.1 kg-90.7 kg] 80.1 kg (07/23 0100) Last BM Date: 04/10/19 General:   Pleasant, cooperative in NAD Head:  Normocephalic and  atraumatic. Eyes:   No icterus.   Conjunctiva pink. PERRLA. Ears:  Normal auditory acuity. Neck:  Supple; no masses or thyroidomegaly Lungs: Respirations even and unlabored. Lungs clear to auscultation bilaterally.   No wheezes, crackles, or rhonchi.  Heart:  Regular rate and rhythm;  Without murmur, clicks, rubs or gallops Abdomen:  Soft, nondistended, nontender. Normal bowel sounds. No appreciable masses or hepatomegaly.  No rebound or guarding.  Neurologic:  Alert and oriented x3;  grossly normal neurologically. Skin:  Intact without significant lesions or rashes. Cervical Nodes:  No significant cervical adenopathy. Psych:  Alert and cooperative. Normal affect.  LAB RESULTS: Recent Labs    04/10/19 1043 04/11/19 0347  WBC 7.9 5.7  HGB 8.4* 7.2*  HCT 25.6* 22.9*  PLT 603* 513*   BMET Recent Labs    04/10/19 1043 04/11/19 0347  NA 134* 136  K 4.2 3.8  CL 98 102  CO2 25 28  GLUCOSE 170* 112*  BUN 42* 33*  CREATININE 1.03 0.78  CALCIUM 9.0 8.5*   LFT No results for input(s): PROT, ALBUMIN, AST, ALT, ALKPHOS, BILITOT, BILIDIR, IBILI in the last 72 hours. PT/INR Recent Labs    04/10/19 1043 04/11/19 0828  LABPROT 78.7* 18.8*  INR 10.1* 1.6*    STUDIES: Ct Head Wo Contrast  Result Date: 04/10/2019 CLINICAL DATA:  Recent nose bleeds and elevated INR, altered level of consciousness EXAM: CT HEAD WITHOUT CONTRAST TECHNIQUE: Contiguous axial images were obtained from the base of the skull through the vertex without intravenous contrast. COMPARISON:  None. FINDINGS: Brain: Mild atrophic and chronic white matter ischemic changes are seen. No findings to suggest acute hemorrhage, acute infarction or space-occupying mass lesion are noted. Vascular: No hyperdense vessel or unexpected calcification. Skull: Normal. Negative for fracture or focal lesion. Sinuses/Orbits: No acute finding. Other: None. IMPRESSION: Atrophic and chronic white matter ischemic changes without acute  abnormality. Electronically Signed   By: Inez Catalina M.D.   On: 04/10/2019 15:31      Impression / Plan:   Tony Hinton is a 78 y.o. y/o male admitted with epistaxis and associated drop in hemoglobin secondary to a supratherapeutic INR given for Leyden 5 mutation.  Hemoglobin on admission was around 8 g and today 7.2 g.  Normocytic in nature.  Most likely related to epistaxis as per history.  INR has already returned to 1.6.  Suggest to monitor for epistaxis and appropriate ENT consultation.  If patient has hematemesis or ongoing melena then GI work-up would be indicated but patient here has clear history of epistaxis.  Epistaxis can also cause stool occult to be positive as well as black tarry stools.  Thank you for involving me in the care of this patient.  LOS: 0 days   Tony Bellows, MD  04/11/2019, 10:03 AM

## 2019-04-11 NOTE — Progress Notes (Signed)
[facility] [place] NAME: Tony Hinton    MR#:  [id number]  DATE OF BIRTH:  01-01-1941  SUBJECTIVE:   Patient presents to the ER due to supratherapeutic INR.  Also noted to have anemia.  Also states a few days ago he noticed melena.  Denies NSAID use. Epistaxis resolved. REVIEW OF SYSTEMS:    Review of Systems  Constitutional: Negative for fever, chills weight loss HENT: Negative for ear pain, nosebleeds, congestion, facial swelling, rhinorrhea, neck pain, neck stiffness and ear discharge.   Respiratory: Negative for cough, shortness of breath, wheezing  Cardiovascular: Negative for chest pain, palpitations and leg swelling.  Gastrointestinal: Negative for heartburn, abdominal pain, vomiting, diarrhea or consitpation Genitourinary: Negative for dysuria, urgency, frequency, hematuria Musculoskeletal: Negative for back pain or joint pain Neurological: Negative for dizziness, seizures, syncope, focal weakness,  numbness and headaches.  Hematological: Does not bruise/bleed easily.  Psychiatric/Behavioral: Negative for hallucinations, confusion, dysphoric mood    Tolerating Diet: yes      DRUG ALLERGIES:   Allergies  Allergen Reactions  . Penicillins   . Sulfa Antibiotics Other (See Comments)    Effects eyes  . Shellfish Allergy Other (See Comments) and Rash    Effects his eyes    VITALS:  Blood pressure 107/74, pulse 76, temperature 97.7 F (36.5 C), temperature source Oral, resp. rate 17, height 5\' 7"  (1.702 m), weight 80.1 kg, SpO2 94 %.  PHYSICAL EXAMINATION:  Constitutional: Appears well-developed and well-nourished. No distress. HENT: Normocephalic. Marland Kitchen Oropharynx is clear and moist.  Eyes: Conjunctivae and EOM are normal. PERRLA, no scleral icterus.  Neck: Normal ROM. Neck supple. No JVD. No tracheal deviation. CVS: RRR, S1/S2 +, no murmurs, no gallops, no carotid bruit.  Pulmonary: Effort and breath sounds normal, no  stridor, rhonchi, wheezes, rales.  Abdominal: Soft. BS +,  no distension, tenderness, rebound or guarding.  Musculoskeletal: Normal range of motion. No edema and no tenderness.  Neuro: Alert. CN 2-12 grossly intact. No focal deficits. Skin: Skin is warm and dry. No rash noted. Psychiatric: Normal mood and affect.      LABORATORY PANEL:   CBC Recent Labs  Lab 04/11/19 0347  WBC 5.7  HGB 7.2*  HCT 22.9*  PLT 513*   ------------------------------------------------------------------------------------------------------------------  Chemistries  Recent Labs  Lab 04/11/19 0347  NA 136  K 3.8  CL 102  CO2 28  GLUCOSE 112*  BUN 33*  CREATININE 0.78  CALCIUM 8.5*   ------------------------------------------------------------------------------------------------------------------  Cardiac Enzymes No results for input(s): TROPONINI in the last 168 hours. ------------------------------------------------------------------------------------------------------------------  RADIOLOGY:  Ct Head Wo Contrast  Result Date: 04/10/2019 CLINICAL DATA:  Recent nose bleeds and elevated INR, altered level of consciousness EXAM: CT HEAD WITHOUT CONTRAST TECHNIQUE: Contiguous axial images were obtained from the base of the skull through the vertex without intravenous contrast. COMPARISON:  None. FINDINGS: Brain: Mild atrophic and chronic white matter ischemic changes are seen. No findings to suggest acute hemorrhage, acute infarction or space-occupying mass lesion are noted. Vascular: No hyperdense vessel or unexpected calcification. Skull: Normal. Negative for fracture or focal lesion. Sinuses/Orbits: No acute finding. Other: None. IMPRESSION: Atrophic and chronic white matter ischemic changes without acute abnormality. Electronically Signed   By: Inez Catalina M.D.   On: 04/10/2019 15:31     ASSESSMENT AND PLAN:   78 year old male with history of factor V Leiden on Coumadin who presented to the  emergency room due to supratherapeutic INR.  1.  Supratherapeutic INR: INR  1.6 Pharmacy consultation  2.  Acute on chronic anemia: Anemia panel consistent with chronic disease. Due to melena I had Dr. Vicente Males see patient in consultation. Anemia felt to be due to epistaxis. As per GI patient has hematemesis or ongoing melena then GI work-up would be indicated. CBC for a.m. Epistaxis can cause stool occult to be positive.  3.  Factor V Leiden: Patient will continue back on Coumadin  4.  Hypothyroidism: Continue Synthroid    Management plans discussed with the patient and HE is in agreement.  CODE STATUS: DNR  TOTAL TIME TAKING CARE OF THIS PATIENT: 30 minutes.     POSSIBLE D/C TOMORROW, DEPENDING ON CLINICAL CONDITION.   Bettey Costa M.D on 04/11/2019 at 12:39 PM  Between 7am to 6pm - Pager - [phone] After 6pm go to [email or web address] - password [email or web address] [facility]  Office  [phone]  CC: Primary care physician; Adin Hector, MD  Note: This dictation was prepared with Dragon dictation along with smaller phrase technology. Any transcriptional errors that result from this process are unintentional.

## 2019-04-12 ENCOUNTER — Other Ambulatory Visit
Admission: RE | Admit: 2019-04-12 | Discharge: 2019-04-12 | Disposition: A | Discharge status: Home / Self Care | Payer: Medicare HMO | Source: Ambulatory Visit | Attending: Sports Medicine | Admitting: Sports Medicine

## 2019-04-12 ENCOUNTER — Other Ambulatory Visit: Payer: Self-pay | Admitting: Student

## 2019-04-12 DIAGNOSIS — M75122 Complete rotator cuff tear or rupture of left shoulder, not specified as traumatic: Secondary | ICD-10-CM | POA: Diagnosis present

## 2019-04-12 DIAGNOSIS — M25512 Pain in left shoulder: Secondary | ICD-10-CM | POA: Diagnosis present

## 2019-04-12 DIAGNOSIS — M25412 Effusion, left shoulder: Secondary | ICD-10-CM | POA: Diagnosis present

## 2019-04-12 DIAGNOSIS — M12812 Other specific arthropathies, not elsewhere classified, left shoulder: Secondary | ICD-10-CM | POA: Insufficient documentation

## 2019-04-12 DIAGNOSIS — G8929 Other chronic pain: Secondary | ICD-10-CM | POA: Insufficient documentation

## 2019-04-12 LAB — PROTIME-INR
INR: 1.6 — ABNORMAL HIGH (ref 0.8–1.2)
Prothrombin Time: 18.9 seconds — ABNORMAL HIGH (ref 11.4–15.2)

## 2019-04-12 LAB — SYNOVIAL CELL COUNT + DIFF, W/ CRYSTALS
Crystals, Fluid: NONE SEEN
Crystals, Fluid: NONE SEEN
Eosinophils-Synovial: 0 %
Eosinophils-Synovial: 0 %
Lymphocytes-Synovial Fld: 23 %
Lymphocytes-Synovial Fld: 8 %
Monocyte-Macrophage-Synovial Fluid: 0 %
Monocyte-Macrophage-Synovial Fluid: 1 %
Neutrophil, Synovial: 77 %
Neutrophil, Synovial: 91 %
Other Cells-SYN: 0
Other Cells-SYN: 0
WBC, Synovial: 1247 /mm3 — ABNORMAL HIGH (ref 0–200)
WBC, Synovial: 11780 /mm3 — ABNORMAL HIGH (ref 0–200)

## 2019-04-12 LAB — CBC
HCT: 23.2 % — ABNORMAL LOW (ref 39.0–52.0)
Hemoglobin: 7.2 g/dL — ABNORMAL LOW (ref 13.0–17.0)
MCH: 30 pg (ref 26.0–34.0)
MCHC: 31 g/dL (ref 30.0–36.0)
MCV: 96.7 fL (ref 80.0–100.0)
Platelets: 560 10*3/uL — ABNORMAL HIGH (ref 150–400)
RBC: 2.4 MIL/uL — ABNORMAL LOW (ref 4.22–5.81)
RDW: 14.6 % (ref 11.5–15.5)
WBC: 5 10*3/uL (ref 4.0–10.5)
nRBC: 0 % (ref 0.0–0.2)

## 2019-04-12 LAB — GLUCOSE, CAPILLARY: Glucose-Capillary: 104 mg/dL — ABNORMAL HIGH (ref 70–99)

## 2019-04-12 MED ORDER — WARFARIN SODIUM 7.5 MG PO TABS
8.5000 mg | ORAL_TABLET | Freq: Once | ORAL | Status: DC
  Filled 2019-04-12: qty 1

## 2019-04-12 MED ORDER — OXYCODONE HCL 5 MG PO TABS
5.0000 mg | ORAL_TABLET | Freq: Four times a day (QID) | ORAL | Status: DC | PRN
  Administered 2019-04-12: 02:00:00 5 mg via ORAL
  Filled 2019-04-12: qty 1

## 2019-04-12 MED ORDER — WARFARIN SODIUM 4 MG PO TABS: 8.5000 mg | ORAL_TABLET | Freq: Once | ORAL | 0 refills | Status: AC

## 2019-04-12 NOTE — Progress Notes (Signed)
Fluid labs placed in [facility].  Opened in error.

## 2019-04-12 NOTE — Progress Notes (Signed)
Discharge summary reviewed with verbal understanding. Called spouse Dores. Will transport Pt to [facility] for 3:30pm appt

## 2019-04-12 NOTE — Progress Notes (Signed)
[place] for Warfarin Indication: Factor V Leiden deficiency  Allergies  Allergen Reactions  . Penicillins   . Sulfa Antibiotics Other (See Comments)    Effects eyes  . Shellfish Allergy Other (See Comments) and Rash    Effects his eyes    Patient Measurements: Height: 5\' 7"  (170.2 cm) Weight: 174 lb 6.4 oz (79.1 kg) IBW/kg (Calculated) : 66.1 Heparin Dosing Weight:    Vital Signs: Temp: 97.5 F (36.4 C) (07/24 0758) Temp Source: Oral (07/24 0758) BP: 128/77 (07/24 0758) Pulse Rate: 75 (07/24 0758)  Labs: Recent Labs    04/10/19 1043 04/11/19 0347 04/11/19 0828 04/11/19 1019 04/12/19 0408  HGB 8.4* 7.2*  --   --  7.2*  HCT 25.6* 22.9*  --   --  23.2*  PLT 603* 513*  --   --  560*  LABPROT 78.7*  --  18.8* 18.5* 18.9*  INR 10.1*  --  1.6* 1.6* 1.6*  CREATININE 1.03 0.78  --   --   --     Estimated Creatinine Clearance: 73.4 mL/min (by C-G formula based on SCr of 0.78 mg/dL).   Medical History: Past Medical History:  Diagnosis Date  . Arthritis   . Asthma   . 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 Factor 5 Leiden mutation, heterozygous ([place])   . Thyroid disease     Medications:  Scheduled:  . allopurinol  100 mg Oral Daily  . docusate sodium  100 mg Oral BID  . fluticasone furoate-vilanterol  1 puff Inhalation Daily   And  . umeclidinium bromide  1 puff Inhalation Daily  . folic acid  1 mg Oral Daily  . levothyroxine  75 mcg Oral Q0600  . vitamin B-12  1,000 mcg Oral Daily  . Warfarin - Pharmacist Dosing Inpatient   Does not apply q1800   Infusions:  . sodium chloride      Assessment: 78 yo M to be resumed on Warfarin for Factor V Leiden deficiency. Pt admitted with hematoma/epistaxis+ elevated INR of 10.1.  Patient was given Vit K 2.5 mg IV x1 on 7/22.  Home dose:  Warfarin  8.5 mg daily per note from 04/05/2019.   Date INR Dose 7/22 10.1 none 7/23 1.6 7.5 mg 7/24 1.6  Goal of Therapy:  INR 2-3 Monitor  platelets by anticoagulation protocol: Yes   Plan:  Epistaxis resolved Will order Warfarin 8.5 mg x 1 tonight.. Patient received Vit K which may affect the time it takes to get back to a therapeutic INR. F/u INR in am and CBC every 3 days per protocol. Hgb 7.2 Plt 560  Sahily Biddle A 04/12/2019,8:05 AM

## 2019-04-12 NOTE — Progress Notes (Signed)
Left VM for wife of patient

## 2019-04-12 NOTE — TOC Progression Note (Signed)
Transition of Care [facility]) - Progression Note    Patient Details  Name: Tony Hinton MRN: [id number] Date of Birth: [birth year]  Transition of Care [facility]) CM/SW [place], RN Phone Number: 04/12/2019, 11:34 AM  Clinical Narrative:    [facility] is unable to take the patient without a week dealy, they can't see until 7/31 [facility] will see if they are able to see the patient this weekend for an INR draw and let me know   Expected Discharge Plan: [place] Barriers to Discharge: Barriers Resolved  Expected Discharge Plan and Services Expected Discharge Plan: [place]   Discharge Planning Services: CM Consult Post Acute Care Choice: [place] arrangements for the past 2 months: Single Family Home Expected Discharge Date: 04/12/19               DME Arranged: N/A         HH Arranged: PT HH Agency: [facility] Date HH Agency Contacted: 04/12/19 Time HH Agency Contacted: [phone]     Social Determinants of Health (SDOH) Interventions    Readmission Risk Interventions No flowsheet data found.

## 2019-04-12 NOTE — Discharge Summary (Signed)
[place] [facility] [place] NAME: Tony Hinton    MR#:  [id number]  DATE OF BIRTH:  [birth year]  DATE OF ADMISSION:  04/10/2019 ADMITTING PHYSICIAN: Epifanio Lesches, MD  DATE OF DISCHARGE: 04/12/2019  PRIMARY CARE PHYSICIAN: Tama High III, MD    ADMISSION DIAGNOSIS:  Acute blood loss anemia [D62] Supratherapeutic INR [R79.1]  DISCHARGE DIAGNOSIS:  Active Problems:   Supratherapeutic INR   SECONDARY DIAGNOSIS:   Past Medical History:  Diagnosis Date  . Arthritis   . Asthma   . 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 Factor 5 Leiden mutation, heterozygous ([place])   . Thyroid disease     HOSPITAL COURSE:   78 year old male with history of factor V Leiden on Coumadin who presented to the emergency room due to supratherapeutic INR.  1.  Supratherapeutic INR is down to less than 2.  I do not feel comfortable bridging the patient with Lovenox due to anemia.  He will be discharged on Coumadin 8.5 mg daily.  He will have an INR and CBC checked.   2.  Acute on chronic anemia: Anemia panel was consistent with chronic disease. Patient was evaluated GI.  There is not a GI cause for the anemia.  Acute anemia is due to epistaxis from supratherapeutic INR.  Epistaxis is completely resolved.  Hemoglobin has remained stable.  3.  Factor V Leiden: As mentioned patient will not be bridged due to the anemia.  He needs close monitoring of his CBC and INR.  4.  Hypothyroidism: Continue Synthroid   5.  Left shoulder pain: Patient has followed Dr. Roland Rack and will have outpatient follow-up.  DISCHARGE CONDITIONS AND DIET:   Stable for discharge regular diet  CONSULTS OBTAINED:  Treatment Team:  Jonathon Bellows, MD  DRUG ALLERGIES:   Allergies  Allergen Reactions  . Penicillins   . Sulfa Antibiotics Other (See Comments)    Effects eyes  . Shellfish Allergy Other (See Comments) and Rash    Effects his eyes    DISCHARGE MEDICATIONS:   Allergies as of  04/12/2019      Reactions   Penicillins    Sulfa Antibiotics Other (See Comments)   Effects eyes   Shellfish Allergy Other (See Comments), Rash   Effects his eyes      Medication List    TAKE these medications   allopurinol 100 MG tablet Commonly known as: ZYLOPRIM Take 100 mg by mouth daily.   Delta D3 10 MCG (400 UNIT) Tabs tablet Generic drug: cholecalciferol Take 1 tablet by mouth daily.   Fluticasone-Umeclidin-Vilant 100-62.5-25 MCG/INH Aepb Inhale 1 puff into the lungs daily.   folic acid 1 MG tablet Commonly known as: FOLVITE Take 1 mg by mouth daily.   HYDROcodone-acetaminophen 5-325 MG tablet Commonly known as: NORCO/VICODIN Take 1 tablet by mouth every 8 (eight) hours as needed for pain.   levothyroxine 75 MCG tablet Commonly known as: SYNTHROID Take 75 mcg by mouth daily. Take on an empty stomach with a glass of water at least 30-60 minutes before breakfast   Proventil HFA 108 (90 Base) MCG/ACT inhaler Generic drug: albuterol Inhale 2 puffs into the lungs every 4 (four) hours as needed.   vitamin B-12 1000 MCG tablet Commonly known as: CYANOCOBALAMIN Take 1,000 mcg by mouth daily.   warfarin 4 MG tablet Commonly known as: COUMADIN Take 2 tablets (8 mg total) by mouth one time only at 6 PM. What changed:   medication strength  how  much to take  when to take this  Another medication with the same name was removed. Continue taking this medication, and follow the directions you see here.         Today   CHIEF COMPLAINT:  Doing well  Denies chest pain, shortness of breath or nausea.   VITAL SIGNS:  Blood pressure 128/77, pulse 75, temperature (!) 97.5 F (36.4 C), temperature source Oral, resp. rate 18, height 5\' 7"  (1.702 m), weight 79.1 kg, SpO2 97 %.   REVIEW OF SYSTEMS:  Review of Systems  Constitutional: Negative.  Negative for chills, fever and malaise/fatigue.  HENT: Negative.  Negative for ear discharge, ear pain, hearing loss,  nosebleeds and sore throat.   Eyes: Negative.  Negative for blurred vision and pain.  Respiratory: Negative.  Negative for cough, hemoptysis, shortness of breath and wheezing.   Cardiovascular: Negative.  Negative for chest pain, palpitations and leg swelling.  Gastrointestinal: Negative.  Negative for abdominal pain, blood in stool, diarrhea, nausea and vomiting.  Genitourinary: Negative.  Negative for dysuria.  Musculoskeletal: Positive for joint pain. Negative for back pain.  Skin: Negative.   Neurological: Negative for dizziness, tremors, speech change, focal weakness, seizures and headaches.  Endo/Heme/Allergies: Negative.  Does not bruise/bleed easily.  Psychiatric/Behavioral: Negative.  Negative for depression, hallucinations and suicidal ideas.     PHYSICAL EXAMINATION:  GENERAL:  78 y.o.-year-old patient lying in the bed with no acute distress.  NECK:  Supple, no jugular venous distention. No thyroid enlargement, no tenderness.  LUNGS: Normal breath sounds bilaterally, no wheezing, rales,rhonchi  No use of accessory muscles of respiration.  CARDIOVASCULAR: S1, S2 normal. No murmurs, rubs, or gallops.  ABDOMEN: Soft, non-tender, non-distended. Bowel sounds present. No organomegaly or mass.  EXTREMITIES: No pedal edema, cyanosis, or clubbing.  PSYCHIATRIC: The patient is alert and oriented x 3.  SKIN: No obvious rash, lesion, or ulcer.   DATA REVIEW:   CBC Recent Labs  Lab 04/12/19 0408  WBC 5.0  HGB 7.2*  HCT 23.2*  PLT 560*    Chemistries  Recent Labs  Lab 04/11/19 0347  NA 136  K 3.8  CL 102  CO2 28  GLUCOSE 112*  BUN 33*  CREATININE 0.78  CALCIUM 8.5*    Cardiac Enzymes No results for input(s): TROPONINI in the last 168 hours.  Microbiology Results  @MICRORSLT48 @  RADIOLOGY:  Ct Head Wo Contrast  Result Date: 04/10/2019 CLINICAL DATA:  Recent nose bleeds and elevated INR, altered level of consciousness EXAM: CT HEAD WITHOUT CONTRAST TECHNIQUE:  Contiguous axial images were obtained from the base of the skull through the vertex without intravenous contrast. COMPARISON:  None. FINDINGS: Brain: Mild atrophic and chronic white matter ischemic changes are seen. No findings to suggest acute hemorrhage, acute infarction or space-occupying mass lesion are noted. Vascular: No hyperdense vessel or unexpected calcification. Skull: Normal. Negative for fracture or focal lesion. Sinuses/Orbits: No acute finding. Other: None. IMPRESSION: Atrophic and chronic white matter ischemic changes without acute abnormality. Electronically Signed   By: Inez Catalina M.D.   On: 04/10/2019 15:31      Allergies as of 04/12/2019      Reactions   Penicillins    Sulfa Antibiotics Other (See Comments)   Effects eyes   Shellfish Allergy Other (See Comments), Rash   Effects his eyes      Medication List    TAKE these medications   allopurinol 100 MG tablet Commonly known as: ZYLOPRIM Take 100 mg by mouth daily.  Delta D3 10 MCG (400 UNIT) Tabs tablet Generic drug: cholecalciferol Take 1 tablet by mouth daily.   Fluticasone-Umeclidin-Vilant 100-62.5-25 MCG/INH Aepb Inhale 1 puff into the lungs daily.   folic acid 1 MG tablet Commonly known as: FOLVITE Take 1 mg by mouth daily.   HYDROcodone-acetaminophen 5-325 MG tablet Commonly known as: NORCO/VICODIN Take 1 tablet by mouth every 8 (eight) hours as needed for pain.   levothyroxine 75 MCG tablet Commonly known as: SYNTHROID Take 75 mcg by mouth daily. Take on an empty stomach with a glass of water at least 30-60 minutes before breakfast   Proventil HFA 108 (90 Base) MCG/ACT inhaler Generic drug: albuterol Inhale 2 puffs into the lungs every 4 (four) hours as needed.   vitamin B-12 1000 MCG tablet Commonly known as: CYANOCOBALAMIN Take 1,000 mcg by mouth daily.   warfarin 4 MG tablet Commonly known as: COUMADIN Take 2 tablets (8 mg total) by mouth one time only at 6 PM. What changed:    medication strength  how much to take  when to take this  Another medication with the same name was removed. Continue taking this medication, and follow the directions you see here.         HH will check INR/CBC and send to Dr Caryl Comes   Management plans discussed with the patient and he is in agreement. Stable for discharge   Patient should follow up with pcp  CODE STATUS:     Code Status Orders  (From admission, onward)         Start     Ordered   04/11/19 0955  Do not attempt resuscitation (DNR)  Continuous    Question Answer Comment  In the event of cardiac or respiratory ARREST Do not call a "code blue"   In the event of cardiac or respiratory ARREST Do not perform Intubation, CPR, defibrillation or ACLS   In the event of cardiac or respiratory ARREST Use medication by any route, position, wound care, and other measures to relive pain and suffering. May use oxygen, suction and manual treatment of airway obstruction as needed for comfort.      04/11/19 0954        Code Status History    Date Active Date Inactive Code Status Order ID Comments User Context   04/10/2019 2009 04/11/2019 9628 Full Code [id number]  Epifanio Lesches, MD ED   Advance Care Planning Activity      TOTAL TIME TAKING CARE OF THIS PATIENT: 38 minutes.    Note: This dictation was prepared with Dragon dictation along with smaller phrase technology. Any transcriptional errors that result from this process are unintentional.  Bettey Costa M.D on 04/12/2019 at 11:51 AM  Between 7am to 6pm - Pager - [phone] After 6pm go to [email or web address] - password [email or web address] [place] [facility]  Office  [phone]  CC: Primary care physician; Adin Hector, MD

## 2019-04-12 NOTE — TOC Transition Note (Signed)
Transition of Care [facility]) - CM/SW Discharge Note   Patient Details  Name: Tony Hinton MRN: [id number] Date of Birth: Mar 24, 1941  Transition of Care [facility]) CM/SW Contact:  Su Hilt, RN Phone Number: 04/12/2019, 11:04 AM   Clinical Narrative:     Met with the patient to discuss DC plan and needs He usually lives alone but his son will be staying with him he does have a RW at home He wants to use [facility] services, I notified Renea Ee, she accepted Transportation is provided by the family when he cant drive Dr Cleda Mccreedy is his PCP and he is up to date with visits He uses [facility] as a pharmacy abnd can afford medications No further needs  Final next level of care: Home w Home Health Services Barriers to Discharge: Barriers Resolved   Patient Goals and CMS Choice Patient states their goals for this hospitalization and ongoing recovery are:: go home CMS Medicare.gov Compare Post Acute Care list provided to:: Patient Choice offered to / list presented to : Patient  Discharge Placement                       Discharge Plan and Services   Discharge Planning Services: CM Consult Post Acute Care Choice: Home Health          DME Arranged: N/A         HH Arranged: PT HH Agency: [facility] Date [facility] Agency Contacted: 04/12/19 Time HH Agency Contacted: [phone]    Social Determinants of Health (SDOH) Interventions     Readmission Risk Interventions No flowsheet data found.

## 2019-04-14 ENCOUNTER — Other Ambulatory Visit
Admission: RE | Admit: 2019-04-14 | Discharge: 2019-04-14 | Disposition: A | Discharge status: Home / Self Care | Payer: Medicare HMO | Source: Ambulatory Visit | Attending: Internal Medicine | Admitting: Internal Medicine

## 2019-04-14 DIAGNOSIS — Z7901 Long term (current) use of anticoagulants: Secondary | ICD-10-CM | POA: Diagnosis not present

## 2019-04-14 DIAGNOSIS — Z5181 Encounter for therapeutic drug level monitoring: Secondary | ICD-10-CM | POA: Insufficient documentation

## 2019-04-14 LAB — PROTIME-INR
INR: 2.6 — ABNORMAL HIGH (ref 0.8–1.2)
Prothrombin Time: 27.1 seconds — ABNORMAL HIGH (ref 11.4–15.2)

## 2019-04-14 LAB — CBC
HCT: 26.6 % — ABNORMAL LOW (ref 39.0–52.0)
Hemoglobin: 8.3 g/dL — ABNORMAL LOW (ref 13.0–17.0)
MCH: 30.5 pg (ref 26.0–34.0)
MCHC: 31.2 g/dL (ref 30.0–36.0)
MCV: 97.8 fL (ref 80.0–100.0)
Platelets: 683 10*3/uL — ABNORMAL HIGH (ref 150–400)
RBC: 2.72 MIL/uL — ABNORMAL LOW (ref 4.22–5.81)
RDW: 15.6 % — ABNORMAL HIGH (ref 11.5–15.5)
WBC: 5.7 10*3/uL (ref 4.0–10.5)
nRBC: 0 % (ref 0.0–0.2)

## 2019-04-16 LAB — BODY FLUID CULTURE
Culture: NO GROWTH
Culture: NO GROWTH
Gram Stain: NONE SEEN

## 2019-05-21 ENCOUNTER — Other Ambulatory Visit: Payer: Self-pay

## 2019-05-21 ENCOUNTER — Encounter
Admission: RE | Admit: 2019-05-21 | Discharge: 2019-05-21 | Disposition: A | Discharge status: Home / Self Care | Payer: Medicare HMO | Source: Ambulatory Visit | Attending: Surgery | Admitting: Surgery

## 2019-05-21 DIAGNOSIS — Z01812 Encounter for preprocedural laboratory examination: Secondary | ICD-10-CM | POA: Insufficient documentation

## 2019-05-21 HISTORY — DX: Chronic obstructive pulmonary disease, unspecified: J44.9

## 2019-05-21 HISTORY — DX: Hypothyroidism, unspecified: E03.9

## 2019-05-21 HISTORY — DX: Dyspnea, unspecified: R06.00

## 2019-05-21 HISTORY — DX: Anemia, unspecified: D64.9

## 2019-05-21 LAB — CBC
HCT: 39.8 % (ref 39.0–52.0)
Hemoglobin: 12.3 g/dL — ABNORMAL LOW (ref 13.0–17.0)
MCH: 29.6 pg (ref 26.0–34.0)
MCHC: 30.9 g/dL (ref 30.0–36.0)
MCV: 95.9 fL (ref 80.0–100.0)
Platelets: 225 10*3/uL (ref 150–400)
RBC: 4.15 MIL/uL — ABNORMAL LOW (ref 4.22–5.81)
RDW: 14.9 % (ref 11.5–15.5)
WBC: 5.1 10*3/uL (ref 4.0–10.5)
nRBC: 0 % (ref 0.0–0.2)

## 2019-05-21 LAB — SURGICAL PCR SCREEN
MRSA, PCR: NEGATIVE
Staphylococcus aureus: POSITIVE — AB

## 2019-05-21 LAB — TYPE AND SCREEN
ABO/RH(D): O NEG
Antibody Screen: NEGATIVE
Extend sample reason: TRANSFUSED

## 2019-05-21 LAB — BASIC METABOLIC PANEL
Anion gap: 8 (ref 5–15)
BUN: 17 mg/dL (ref 8–23)
CO2: 31 mmol/L (ref 22–32)
Calcium: 9.6 mg/dL (ref 8.9–10.3)
Chloride: 104 mmol/L (ref 98–111)
Creatinine, Ser: 0.85 mg/dL (ref 0.61–1.24)
GFR calc Af Amer: >60 mL/min (ref >60)
GFR calc non Af Amer: >60 mL/min (ref >60)
Glucose, Bld: 88 mg/dL (ref 70–99)
Potassium: 4.6 mmol/L (ref 3.5–5.1)
Sodium: 143 mmol/L (ref 135–145)

## 2019-05-21 LAB — URINALYSIS, ROUTINE W REFLEX MICROSCOPIC
Bilirubin Urine: NEGATIVE
Glucose, UA: NEGATIVE mg/dL
Hgb urine dipstick: NEGATIVE
Ketones, ur: NEGATIVE mg/dL
Leukocytes,Ua: NEGATIVE
Nitrite: NEGATIVE
Protein, ur: NEGATIVE mg/dL
Specific Gravity, Urine: 1.009 (ref 1.005–1.030)
pH: 6 (ref 5.0–8.0)

## 2019-05-21 LAB — PROTIME-INR
INR: 2.2 — ABNORMAL HIGH (ref 0.8–1.2)
Prothrombin Time: 24 seconds — ABNORMAL HIGH (ref 11.4–15.2)

## 2019-05-21 NOTE — Pre-Procedure Instructions (Signed)
EKG My review and personal interpretation at Time:  10:39  Indication: abnormal lab  Rate: 95  Rhythm: sinus Axis: normal Other: nonspecific t wave changes, non specific st abn ____________________________________________  RADIOLOGY  I personally reviewed all radiographic images ordered to evaluate for the above acute complaints and reviewed radiology reports and findings.  These findings were personally discussed with the patient.  Please see medical record for radiology report.  ____________________________________________   PROCEDURES  Procedure(s) performed:  .Critical Care Performed by: Merlyn Lot, MD Authorized by: Merlyn Lot, MD   Critical care provider statement:    Critical care time (minutes):  30   Critical care time was exclusive of:  Separately billable procedures and treating other patients   Critical care was necessary to treat or prevent imminent or life-threatening deterioration of the following conditions:  Circulatory failure   Critical care was time spent personally by me on the following activities:  Development of treatment plan with patient or surrogate, discussions with consultants, evaluation of patient's response to treatment, examination of patient, obtaining history from patient or surrogate, ordering and performing treatments and interventions, ordering and review of laboratory studies, ordering and review of radiographic studies, pulse oximetry, re-evaluation of patient's condition and review of old charts      Critical Care performed: yes ____________________________________________   INITIAL IMPRESSION / [place] / ED COURSE  Pertinent labs & imaging results that were available during my care of the patient were reviewed by me and considered in my medical decision making (see chart for details).  DDX: Supratherapeutic INR, acute blood loss anemia, DIC, GI bleed, hematoma medication noncompliance  Tony Hinton is a 78 y.o. who presents to the ED with symptoms as described above.  Patient protecting his airways not with any evidence of active massive GI bleeding but certainly has multiple sources from which he has been losing blood and which would explain his low hemoglobin particularly in the setting of supratherapeutic INR.  With his vomiting I will order stat CT head to exclude head bleed.  Will order vitamin K.  Patient will be typed and screened.     Clinical Course as of Apr 09 1648  Wed Apr 10, 2019  1630 CT head without any evidence of bleed.  He reminds hemodynamically stable.  Will give FFP and will discuss with hospitalist for admission.   [PR]    Clinical Course User Index [PR] Merlyn Lot, MD    The patient was evaluated in Emergency Department today for the symptoms described in the history of present illness. He/she was evaluated in the context of the global COVID-19 pandemic, which necessitated consideration that the patient might be at risk for infection with the SARS-CoV-2 virus that causes COVID-19. Institutional protocols and algorithms that pertain to the evaluation of patients at risk for COVID-19 are in a state of rapid change based on information released by regulatory bodies including the CDC and federal and state organizations. These policies and algorithms were followed during the patient's care in the ED.  As part of my medical decision making, I reviewed the following data within the [place] notes reviewed and incorporated, Labs reviewed, notes from prior ED visits and [place] Controlled Substance Database   ____________________________________________   FINAL CLINICAL IMPRESSION(S) / ED DIAGNOSES  Final diagnoses:  Acute blood loss anemia  Supratherapeutic INR      NEW MEDICATIONS STARTED DURING THIS VISIT:     New Prescriptions   No medications on file  Note:  This document was prepared using Dragon  voice recognition software and may include unintentional dictation errors.    Merlyn Lot, MD 04/10/19 1649         Electronically signed by Merlyn Lot, MD at 04/10/2019 4:49 PM

## 2019-05-21 NOTE — Patient Instructions (Signed)
Your procedure is scheduled on: 05-30-19 THURSDAY Report to Same Day Surgery 2nd floor medical mall [facility] Entrance-take elevator on left to 2nd floor.  Check in with surgery information desk.) To find out your arrival time please call [phone] between 1PM - 3PM on 05-29-19 [facility]  Remember: Instructions that are not followed completely may result in serious medical risk, up to and including death, or upon the discretion of your surgeon and anesthesiologist your surgery may need to be rescheduled.    _x___ 1. Do not eat food after midnight the night before your procedure. NO GUM OR CANDY AFTER MIDNIGHT. You may drink clear liquids up to 2 hours before you are scheduled to arrive at the hospital for your procedure.  Do not drink clear liquids within 2 hours of your scheduled arrival to the hospital.  Clear liquids include  --Water or Apple juice without pulp  --Gatorade  --Black Coffee or Clear Tea (No milk, no creamers, do not add anything to the coffee or Tea   ____Ensure clear carbohydrate drink on the way to the hospital for bariatric patients  ____Ensure clear carbohydrate drink 3 hours before surgery.    __x__ 2. No Alcohol for 24 hours before or after surgery.   __x__3. No Smoking or e-cigarettes for 24 prior to surgery.  Do not use any chewable tobacco products for at least 6 hour prior to surgery   ____  4. Bring all medications with you on the day of surgery if instructed.    __x__ 5. Notify your doctor if there is any change in your medical condition     (cold, fever, infections).    x___6. On the morning of surgery brush your teeth with toothpaste and water.  You may rinse your mouth with mouth wash if you wish.  Do not swallow any toothpaste or mouthwash.   Do not wear jewelry, make-up, hairpins, clips or nail polish.  Do not wear lotions, powders, or perfumes.   Do not shave 48 hours prior to surgery. Men may shave face and neck.  Do not bring valuables to  the hospital.    [facility] is not responsible for any belongings or valuables.               Contacts, dentures or bridgework may not be worn into surgery.  Leave your suitcase in the car. After surgery it may be brought to your room.  For patients admitted to the hospital, discharge time is determined by your treatment team.  _  Patients discharged the day of surgery will not be allowed to drive home.  You will need someone to drive you home and stay with you the night of your procedure.    Please read over the following fact sheets that you were given:   [facility] Preparing for Surgery and or MRSA Information   _x___ TAKE THE FOLLOWING MEDICATION THE MORNING OF SURGERY WITH A SMALL SIP OF WATER. These include:  1. ALLOPURINOL   2. LEVOTHYROXINE   3.  4.  5.  6.  ____Fleets enema or Magnesium Citrate as directed.   _x___ Use CHG Soap or sage wipes as directed on instruction sheet   _X___ Use inhalers on the day of surgery and bring to hospital day of surgery-USE YOUR ALBUTEROL INHALER DAY OF SURGERY AND [place]  ____ Stop Metformin and Janumet 2 days prior to surgery.    ____ Take 1/2 of usual insulin dose the night before surgery  and none on the morning surgery.   _x___ Follow recommendations from Cardiologist, Pulmonologist or PCP regarding stopping Aspirin, Coumadin, Plavix ,Eliquis, Effient, or Pradaxa, and Pletal-CALL DR POGGI'S OFFICE AND ASK ABOUT WHEN TO STOP YOUR COUMADIN (WARFARIN)  X____Stop Anti-inflammatories such as Advil, Aleve, Ibuprofen, Motrin, Naproxen, Naprosyn, Goodies powders or aspirin products 7 DAYS PRIOR TO SURGERY- OK to take Tylenol    ____ Stop supplements until after surgery.     ____ Bring C-Pap to the hospital.   [place] ON Monday, September 7th BETWEEN 8AM-10:30 AM FOR YOUR COVID SWAB

## 2019-05-28 ENCOUNTER — Other Ambulatory Visit
Admission: RE | Admit: 2019-05-28 | Discharge: 2019-05-28 | Disposition: A | Discharge status: Home / Self Care | Payer: Medicare HMO | Source: Ambulatory Visit | Attending: Surgery | Admitting: Surgery

## 2019-05-28 ENCOUNTER — Other Ambulatory Visit: Payer: Self-pay

## 2019-05-28 LAB — SARS CORONAVIRUS 2 (TAT 6-24 HRS): SARS Coronavirus 2: NEGATIVE

## 2019-05-30 ENCOUNTER — Inpatient Hospital Stay: Payer: Medicare HMO | Admitting: Anesthesiology

## 2019-05-30 ENCOUNTER — Other Ambulatory Visit: Payer: Self-pay

## 2019-05-30 ENCOUNTER — Inpatient Hospital Stay: Payer: Medicare HMO

## 2019-05-30 ENCOUNTER — Encounter
Admission: RE | Disposition: A | Discharge status: Home Health | Payer: Self-pay | Source: Home / Self Care | Attending: Surgery

## 2019-05-30 ENCOUNTER — Inpatient Hospital Stay
Admission: RE | Admit: 2019-05-30 | Discharge: 2019-05-31 | DRG: 483 | Disposition: A | Discharge status: Home Health | Payer: Medicare HMO | Source: Ambulatory Visit | Attending: Surgery | Admitting: Surgery

## 2019-05-30 DIAGNOSIS — M659 Synovitis and tenosynovitis, unspecified: Secondary | ICD-10-CM | POA: Diagnosis present

## 2019-05-30 DIAGNOSIS — Z01812 Encounter for preprocedural laboratory examination: Secondary | ICD-10-CM

## 2019-05-30 DIAGNOSIS — E039 Hypothyroidism, unspecified: Secondary | ICD-10-CM | POA: Diagnosis present

## 2019-05-30 DIAGNOSIS — Z96612 Presence of left artificial shoulder joint: Secondary | ICD-10-CM

## 2019-05-30 DIAGNOSIS — Z880 Allergy status to penicillin: Secondary | ICD-10-CM

## 2019-05-30 DIAGNOSIS — Z91013 Allergy to seafood: Secondary | ICD-10-CM

## 2019-05-30 DIAGNOSIS — Z86718 Personal history of other venous thrombosis and embolism: Secondary | ICD-10-CM | POA: Diagnosis not present

## 2019-05-30 DIAGNOSIS — M75122 Complete rotator cuff tear or rupture of left shoulder, not specified as traumatic: Secondary | ICD-10-CM | POA: Diagnosis present

## 2019-05-30 DIAGNOSIS — J449 Chronic obstructive pulmonary disease, unspecified: Secondary | ICD-10-CM | POA: Diagnosis present

## 2019-05-30 DIAGNOSIS — Z882 Allergy status to sulfonamides status: Secondary | ICD-10-CM

## 2019-05-30 DIAGNOSIS — Z20828 Contact with and (suspected) exposure to other viral communicable diseases: Secondary | ICD-10-CM | POA: Diagnosis present

## 2019-05-30 DIAGNOSIS — Z79899 Other long term (current) drug therapy: Secondary | ICD-10-CM | POA: Diagnosis not present

## 2019-05-30 DIAGNOSIS — D6851 Activated protein C resistance: Secondary | ICD-10-CM | POA: Diagnosis present

## 2019-05-30 DIAGNOSIS — Z7901 Long term (current) use of anticoagulants: Secondary | ICD-10-CM | POA: Diagnosis not present

## 2019-05-30 DIAGNOSIS — N400 Enlarged prostate without lower urinary tract symptoms: Secondary | ICD-10-CM | POA: Diagnosis present

## 2019-05-30 DIAGNOSIS — Z8547 Personal history of malignant neoplasm of testis: Secondary | ICD-10-CM

## 2019-05-30 DIAGNOSIS — Z79891 Long term (current) use of opiate analgesic: Secondary | ICD-10-CM

## 2019-05-30 DIAGNOSIS — M25512 Pain in left shoulder: Secondary | ICD-10-CM | POA: Diagnosis present

## 2019-05-30 HISTORY — PX: REVERSE SHOULDER ARTHROPLASTY: SHX5054

## 2019-05-30 LAB — TYPE AND SCREEN
ABO/RH(D): O NEG
Antibody Screen: NEGATIVE

## 2019-05-30 LAB — PROTIME-INR
INR: 1 (ref 0.8–1.2)
Prothrombin Time: 13.4 seconds (ref 11.4–15.2)

## 2019-05-30 SURGERY — ARTHROPLASTY, SHOULDER, TOTAL, REVERSE
Anesthesia: General | Site: Shoulder | Laterality: Left

## 2019-05-30 MED ORDER — LACTATED RINGERS IV SOLN
INTRAVENOUS | Status: DC
  Administered 2019-05-30 (×2): via INTRAVENOUS

## 2019-05-30 MED ORDER — BUPIVACAINE LIPOSOME 1.3 % IJ SUSP
INTRAMUSCULAR | Status: AC
  Filled 2019-05-30: qty 20

## 2019-05-30 MED ORDER — ENOXAPARIN SODIUM 40 MG/0.4ML ~~LOC~~ SOLN
40.0000 mg | SUBCUTANEOUS | Status: DC
  Administered 2019-05-31: 08:00:00 40 mg via SUBCUTANEOUS
  Filled 2019-05-30: qty 0.4

## 2019-05-30 MED ORDER — FAMOTIDINE 20 MG PO TABS
20.0000 mg | ORAL_TABLET | Freq: Once | ORAL | Status: AC
  Administered 2019-05-30: 13:00:00 20 mg via ORAL

## 2019-05-30 MED ORDER — SODIUM CHLORIDE 0.9 % IV SOLN
INTRAVENOUS | Status: DC
  Administered 2019-05-30: 19:00:00 via INTRAVENOUS

## 2019-05-30 MED ORDER — SODIUM CHLORIDE (PF) 0.9 % IJ SOLN
INTRAMUSCULAR | Status: AC
  Filled 2019-05-30: qty 50

## 2019-05-30 MED ORDER — DIPHENHYDRAMINE HCL 12.5 MG/5ML PO ELIX: 12.5000 mg | ORAL_SOLUTION | ORAL | Status: DC | PRN

## 2019-05-30 MED ORDER — LIDOCAINE HCL (CARDIAC) PF 100 MG/5ML IV SOSY
PREFILLED_SYRINGE | INTRAVENOUS | Status: DC | PRN
  Administered 2019-05-30: 80 mg via INTRAVENOUS

## 2019-05-30 MED ORDER — METOCLOPRAMIDE HCL 10 MG PO TABS: 5.0000 mg | ORAL_TABLET | Freq: Three times a day (TID) | ORAL | Status: DC | PRN

## 2019-05-30 MED ORDER — SODIUM CHLORIDE 0.9 % IV SOLN
INTRAVENOUS | Status: DC | PRN
  Administered 2019-05-30: 15:00:00 40 ug/min via INTRAVENOUS

## 2019-05-30 MED ORDER — BUPIVACAINE HCL (PF) 0.5 % IJ SOLN
INTRAMUSCULAR | Status: DC | PRN
  Administered 2019-05-30: 10 mL

## 2019-05-30 MED ORDER — LEVOTHYROXINE SODIUM 75 MCG PO TABS
75.0000 ug | ORAL_TABLET | Freq: Every day | ORAL | Status: DC
  Administered 2019-05-31: 06:00:00 75 ug via ORAL
  Filled 2019-05-30: qty 3
  Filled 2019-05-30: qty 1

## 2019-05-30 MED ORDER — TRAMADOL HCL 50 MG PO TABS: 50.0000 mg | ORAL_TABLET | Freq: Four times a day (QID) | ORAL | Status: DC | PRN

## 2019-05-30 MED ORDER — FENTANYL CITRATE (PF) 100 MCG/2ML IJ SOLN
50.0000 ug | Freq: Once | INTRAMUSCULAR | Status: AC
  Administered 2019-05-30: 13:00:00 50 ug via INTRAVENOUS

## 2019-05-30 MED ORDER — FENTANYL CITRATE (PF) 100 MCG/2ML IJ SOLN: 25.0000 ug | INTRAMUSCULAR | Status: DC | PRN

## 2019-05-30 MED ORDER — FLEET ENEMA 7-19 GM/118ML RE ENEM: 1.0000 | ENEMA | Freq: Once | RECTAL | Status: DC | PRN

## 2019-05-30 MED ORDER — PANTOPRAZOLE SODIUM 40 MG PO TBEC
40.0000 mg | DELAYED_RELEASE_TABLET | Freq: Every day | ORAL | Status: DC
  Administered 2019-05-31: 08:00:00 40 mg via ORAL
  Filled 2019-05-30: qty 1

## 2019-05-30 MED ORDER — POLYVINYL ALCOHOL 1.4 % OP SOLN
1.0000 [drp] | Freq: Two times a day (BID) | OPHTHALMIC | Status: DC | PRN
  Filled 2019-05-30: qty 15

## 2019-05-30 MED ORDER — BUPIVACAINE-EPINEPHRINE (PF) 0.5% -1:200000 IJ SOLN
INTRAMUSCULAR | Status: AC
  Filled 2019-05-30: qty 30

## 2019-05-30 MED ORDER — CLINDAMYCIN PHOSPHATE 900 MG/50ML IV SOLN
INTRAVENOUS | Status: AC
  Filled 2019-05-30: qty 50

## 2019-05-30 MED ORDER — POTASSIUM 99 MG PO TABS: 99.0000 mg | ORAL_TABLET | Freq: Every day | ORAL | Status: DC

## 2019-05-30 MED ORDER — METOCLOPRAMIDE HCL 5 MG/ML IJ SOLN: 5.0000 mg | Freq: Three times a day (TID) | INTRAMUSCULAR | Status: DC | PRN

## 2019-05-30 MED ORDER — OXYCODONE HCL 5 MG PO TABS: 5.0000 mg | ORAL_TABLET | ORAL | Status: DC | PRN

## 2019-05-30 MED ORDER — TRANEXAMIC ACID 1000 MG/10ML IV SOLN
INTRAVENOUS | Status: AC
  Filled 2019-05-30: qty 10

## 2019-05-30 MED ORDER — FOLIC ACID 1 MG PO TABS
1.0000 mg | ORAL_TABLET | Freq: Every day | ORAL | Status: DC
  Administered 2019-05-31: 1 mg via ORAL
  Filled 2019-05-30: qty 1

## 2019-05-30 MED ORDER — PHENYLEPHRINE HCL (PRESSORS) 10 MG/ML IV SOLN
INTRAVENOUS | Status: DC | PRN
  Administered 2019-05-30: 100 ug via INTRAVENOUS

## 2019-05-30 MED ORDER — CLINDAMYCIN PHOSPHATE 900 MG/50ML IV SOLN
900.0000 mg | Freq: Once | INTRAVENOUS | Status: AC
  Administered 2019-05-30: 900 mg via INTRAVENOUS

## 2019-05-30 MED ORDER — ONDANSETRON HCL 4 MG/2ML IJ SOLN: 4.0000 mg | Freq: Four times a day (QID) | INTRAMUSCULAR | Status: DC | PRN

## 2019-05-30 MED ORDER — ALLOPURINOL 100 MG PO TABS
100.0000 mg | ORAL_TABLET | ORAL | Status: DC
  Administered 2019-05-31: 11:00:00 100 mg via ORAL
  Filled 2019-05-30: qty 1

## 2019-05-30 MED ORDER — WARFARIN SODIUM 10 MG PO TABS
10.0000 mg | ORAL_TABLET | Freq: Once | ORAL | Status: AC
  Administered 2019-05-30: 10 mg via ORAL
  Filled 2019-05-30: qty 1

## 2019-05-30 MED ORDER — VITAMIN D 25 MCG (1000 UNIT) PO TABS
1000.0000 [IU] | ORAL_TABLET | Freq: Every day | ORAL | Status: DC
  Administered 2019-05-31: 09:00:00 1000 [IU] via ORAL
  Filled 2019-05-30: qty 1

## 2019-05-30 MED ORDER — VITAMIN B-12 1000 MCG PO TABS
1000.0000 ug | ORAL_TABLET | Freq: Every day | ORAL | Status: DC
  Administered 2019-05-31: 08:00:00 1000 ug via ORAL
  Filled 2019-05-30: qty 1

## 2019-05-30 MED ORDER — IPRATROPIUM-ALBUTEROL 0.5-2.5 (3) MG/3ML IN SOLN
RESPIRATORY_TRACT | Status: AC
  Filled 2019-05-30: qty 3

## 2019-05-30 MED ORDER — ACETAMINOPHEN 325 MG PO TABS: 325.0000 mg | ORAL_TABLET | Freq: Four times a day (QID) | ORAL | Status: DC | PRN

## 2019-05-30 MED ORDER — ONDANSETRON HCL 4 MG/2ML IJ SOLN
INTRAMUSCULAR | Status: DC | PRN
  Administered 2019-05-30: 4 mg via INTRAVENOUS

## 2019-05-30 MED ORDER — SUGAMMADEX SODIUM 200 MG/2ML IV SOLN
INTRAVENOUS | Status: DC | PRN
  Administered 2019-05-30: 200 mg via INTRAVENOUS

## 2019-05-30 MED ORDER — LIDOCAINE HCL (PF) 2 % IJ SOLN
INTRAMUSCULAR | Status: AC
  Filled 2019-05-30: qty 10

## 2019-05-30 MED ORDER — LIDOCAINE HCL (PF) 1 % IJ SOLN
INTRAMUSCULAR | Status: AC
  Filled 2019-05-30: qty 5

## 2019-05-30 MED ORDER — ACETAMINOPHEN 500 MG PO TABS
1000.0000 mg | ORAL_TABLET | Freq: Four times a day (QID) | ORAL | Status: AC
  Administered 2019-05-30 – 2019-05-31 (×4): 1000 mg via ORAL
  Filled 2019-05-30 (×4): qty 2

## 2019-05-30 MED ORDER — MIDAZOLAM HCL 2 MG/2ML IJ SOLN
INTRAMUSCULAR | Status: AC
  Administered 2019-05-30: 1 mg via INTRAVENOUS
  Filled 2019-05-30: qty 2

## 2019-05-30 MED ORDER — ACETAMINOPHEN 325 MG PO TABS: 325.0000 mg | ORAL_TABLET | ORAL | Status: DC | PRN

## 2019-05-30 MED ORDER — EPHEDRINE SULFATE 50 MG/ML IJ SOLN
INTRAMUSCULAR | Status: DC | PRN
  Administered 2019-05-30: 15 mg via INTRAVENOUS
  Administered 2019-05-30: 5 mg via INTRAVENOUS

## 2019-05-30 MED ORDER — MAGNESIUM HYDROXIDE 400 MG/5ML PO SUSP: 30.0000 mL | Freq: Every day | ORAL | Status: DC | PRN

## 2019-05-30 MED ORDER — FAMOTIDINE 20 MG PO TABS
ORAL_TABLET | ORAL | Status: AC
  Administered 2019-05-30: 13:00:00 20 mg via ORAL
  Filled 2019-05-30: qty 1

## 2019-05-30 MED ORDER — DOCUSATE SODIUM 100 MG PO CAPS
100.0000 mg | ORAL_CAPSULE | Freq: Two times a day (BID) | ORAL | Status: DC
  Administered 2019-05-30 – 2019-05-31 (×2): 100 mg via ORAL
  Filled 2019-05-30 (×2): qty 1

## 2019-05-30 MED ORDER — IPRATROPIUM-ALBUTEROL 0.5-2.5 (3) MG/3ML IN SOLN
3.0000 mL | RESPIRATORY_TRACT | Status: AC
  Administered 2019-05-30: 3 mL via RESPIRATORY_TRACT

## 2019-05-30 MED ORDER — BUPIVACAINE HCL (PF) 0.5 % IJ SOLN
INTRAMUSCULAR | Status: AC
  Filled 2019-05-30: qty 10

## 2019-05-30 MED ORDER — BISACODYL 10 MG RE SUPP: 10.0000 mg | Freq: Every day | RECTAL | Status: DC | PRN

## 2019-05-30 MED ORDER — BUPIVACAINE-EPINEPHRINE (PF) 0.5% -1:200000 IJ SOLN
INTRAMUSCULAR | Status: DC | PRN
  Administered 2019-05-30: 30 mL

## 2019-05-30 MED ORDER — FENTANYL CITRATE (PF) 100 MCG/2ML IJ SOLN
INTRAMUSCULAR | Status: AC
  Administered 2019-05-30: 13:00:00 50 ug via INTRAVENOUS
  Filled 2019-05-30: qty 2

## 2019-05-30 MED ORDER — FENTANYL CITRATE (PF) 100 MCG/2ML IJ SOLN
INTRAMUSCULAR | Status: AC
  Filled 2019-05-30: qty 2

## 2019-05-30 MED ORDER — ALBUTEROL SULFATE (2.5 MG/3ML) 0.083% IN NEBU
2.5000 mg | INHALATION_SOLUTION | RESPIRATORY_TRACT | Status: DC | PRN
  Administered 2019-05-31: 06:00:00 2.5 mg via RESPIRATORY_TRACT
  Filled 2019-05-30: qty 3

## 2019-05-30 MED ORDER — CLINDAMYCIN PHOSPHATE 600 MG/50ML IV SOLN
600.0000 mg | Freq: Four times a day (QID) | INTRAVENOUS | Status: AC
  Administered 2019-05-30 – 2019-05-31 (×3): 600 mg via INTRAVENOUS
  Filled 2019-05-30 (×4): qty 50

## 2019-05-30 MED ORDER — FENTANYL CITRATE (PF) 100 MCG/2ML IJ SOLN
INTRAMUSCULAR | Status: DC | PRN
  Administered 2019-05-30 (×2): 50 ug via INTRAVENOUS

## 2019-05-30 MED ORDER — HYDROMORPHONE HCL 1 MG/ML IJ SOLN: 0.2500 mg | INTRAMUSCULAR | Status: DC | PRN

## 2019-05-30 MED ORDER — BUPIVACAINE LIPOSOME 1.3 % IJ SUSP
INTRAMUSCULAR | Status: DC | PRN
  Administered 2019-05-30: 20 mL

## 2019-05-30 MED ORDER — PROMETHAZINE HCL 25 MG/ML IJ SOLN: 6.2500 mg | INTRAMUSCULAR | Status: DC | PRN

## 2019-05-30 MED ORDER — MIDAZOLAM HCL 2 MG/2ML IJ SOLN
1.0000 mg | Freq: Once | INTRAMUSCULAR | Status: AC
  Administered 2019-05-30: 13:00:00 1 mg via INTRAVENOUS

## 2019-05-30 MED ORDER — ROCURONIUM BROMIDE 100 MG/10ML IV SOLN
INTRAVENOUS | Status: DC | PRN
  Administered 2019-05-30: 10 mg via INTRAVENOUS
  Administered 2019-05-30: 20 mg via INTRAVENOUS
  Administered 2019-05-30: 50 mg via INTRAVENOUS

## 2019-05-30 MED ORDER — MEPERIDINE HCL 50 MG/ML IJ SOLN: 6.2500 mg | INTRAMUSCULAR | Status: DC | PRN

## 2019-05-30 MED ORDER — WARFARIN - PHARMACIST DOSING INPATIENT: Freq: Every day | Status: DC

## 2019-05-30 MED ORDER — PROPOFOL 10 MG/ML IV BOLUS
INTRAVENOUS | Status: AC
  Filled 2019-05-30: qty 20

## 2019-05-30 MED ORDER — TRANEXAMIC ACID 1000 MG/10ML IV SOLN
INTRAVENOUS | Status: DC | PRN
  Administered 2019-05-30: 1000 mg via TOPICAL

## 2019-05-30 MED ORDER — PROPOFOL 10 MG/ML IV BOLUS
INTRAVENOUS | Status: DC | PRN
  Administered 2019-05-30: 150 mg via INTRAVENOUS

## 2019-05-30 MED ORDER — ACETAMINOPHEN 160 MG/5ML PO SOLN: 325.0000 mg | ORAL | Status: DC | PRN

## 2019-05-30 MED ORDER — ONDANSETRON HCL 4 MG PO TABS: 4.0000 mg | ORAL_TABLET | Freq: Four times a day (QID) | ORAL | Status: DC | PRN

## 2019-05-30 SURGICAL SUPPLY — 74 items
ADAPTER MINI TAPER W/B/PLAT 25 (Miscellaneous) ×2 IMPLANT
ADPR HD STD TPR 25 HUM TI (Miscellaneous) ×1 IMPLANT
APL PRP STRL LF DISP 70% ISPRP (MISCELLANEOUS) ×1
BASEPLATE GLENOSPHERE 25 (Plate) ×1 IMPLANT
BASEPLATE GLENOSPHERE 25MM (Plate) ×1 IMPLANT
BEARING HUMERAL 40 STD VITE (Joint) ×2 IMPLANT
BIT DRILL TWIST 2.7 (BIT) ×1 IMPLANT
BIT DRILL TWIST 2.7MM (BIT) ×1
BLADE SAW SAG 25X90X1.19 (BLADE) ×3 IMPLANT
BRNG HUM STD 40 RVRS SHLDR (Joint) ×1 IMPLANT
CANISTER SUCT 1200ML W/VALVE (MISCELLANEOUS) ×3 IMPLANT
CANISTER SUCT 3000ML PPV (MISCELLANEOUS) ×6 IMPLANT
CHLORAPREP W/TINT 26 (MISCELLANEOUS) ×3 IMPLANT
COOLER POLAR GLACIER W/PUMP (MISCELLANEOUS) ×3 IMPLANT
COVER BACK TABLE REUSABLE LG (DRAPES) ×3 IMPLANT
COVER WAND RF STERILE (DRAPES) ×3 IMPLANT
CRADLE LAMINECT ARM (MISCELLANEOUS) ×3 IMPLANT
DIAL VERSA SHOULDER 40 STD (Joint) ×2 IMPLANT
DRAPE 3/4 80X56 (DRAPES) ×6 IMPLANT
DRAPE INCISE IOBAN 66X45 STRL (DRAPES) ×6 IMPLANT
DRAPE SPLIT 6X30 W/TAPE (DRAPES) ×6 IMPLANT
DRSG OPSITE POSTOP 4X8 (GAUZE/BANDAGES/DRESSINGS) ×3 IMPLANT
ELECT BLADE 6.5 EXT (BLADE) IMPLANT
ELECT CAUTERY BLADE 6.4 (BLADE) ×3 IMPLANT
GLENOSPHERE VERSADIAL 40 +6 (Joint) ×2 IMPLANT
GLOVE BIO SURGEON STRL SZ7.5 (GLOVE) ×12 IMPLANT
GLOVE BIO SURGEON STRL SZ8 (GLOVE) ×12 IMPLANT
GLOVE BIOGEL PI IND STRL 8 (GLOVE) ×1 IMPLANT
GLOVE BIOGEL PI INDICATOR 8 (GLOVE) ×2
GLOVE INDICATOR 8.0 STRL GRN (GLOVE) ×3 IMPLANT
GOWN STRL REUS W/ TWL LRG LVL3 (GOWN DISPOSABLE) ×1 IMPLANT
GOWN STRL REUS W/ TWL XL LVL3 (GOWN DISPOSABLE) ×1 IMPLANT
GOWN STRL REUS W/TWL LRG LVL3 (GOWN DISPOSABLE) ×3
GOWN STRL REUS W/TWL XL LVL3 (GOWN DISPOSABLE) ×3
HOOD PEEL AWAY FLYTE STAYCOOL (MISCELLANEOUS) ×9 IMPLANT
KIT STABILIZATION SHOULDER (MISCELLANEOUS) ×3 IMPLANT
KIT TURNOVER KIT A (KITS) ×3 IMPLANT
MASK FACE SPIDER DISP (MASK) ×3 IMPLANT
MAT ABSORB  FLUID 56X50 GRAY (MISCELLANEOUS) ×2
MAT ABSORB FLUID 56X50 GRAY (MISCELLANEOUS) ×1 IMPLANT
NDL SAFETY ECLIPSE 18X1.5 (NEEDLE) ×1 IMPLANT
NDL SPNL 20GX3.5 QUINCKE YW (NEEDLE) ×1 IMPLANT
NEEDLE HYPO 18GX1.5 SHARP (NEEDLE) ×3
NEEDLE HYPO 22GX1.5 SAFETY (NEEDLE) ×3 IMPLANT
NEEDLE SPNL 20GX3.5 QUINCKE YW (NEEDLE) ×3 IMPLANT
NS IRRIG 500ML POUR BTL (IV SOLUTION) ×3 IMPLANT
PACK ARTHROSCOPY SHOULDER (MISCELLANEOUS) ×3 IMPLANT
PAD WRAPON POLAR SHDR UNIV (MISCELLANEOUS) ×1 IMPLANT
PIN THREADED REVERSE (PIN) ×2 IMPLANT
PULSAVAC PLUS IRRIG FAN TIP (DISPOSABLE) ×3
RETRIEVER SUT HEWSON (MISCELLANEOUS) ×2 IMPLANT
SCREW BONE LOCKING 4.75X30X3.5 (Screw) ×2 IMPLANT
SCREW BONE STRL 6.5MMX25MM (Screw) ×2 IMPLANT
SCREW LOCKING NS 4.75MMX20MM (Screw) ×2 IMPLANT
SCREW NON-LOCK 4.75MMX15MM (Screw) ×2 IMPLANT
SCREW NON-LOCK 4.75X40X3.5 (Screw) ×2 IMPLANT
SLING ULTRA II M (MISCELLANEOUS) ×3 IMPLANT
SOL .9 NS 3000ML IRR  AL (IV SOLUTION) ×2
SOL .9 NS 3000ML IRR AL (IV SOLUTION) ×1
SOL .9 NS 3000ML IRR UROMATIC (IV SOLUTION) ×1 IMPLANT
SPONGE LAP 18X18 RF (DISPOSABLE) ×3 IMPLANT
STAPLER SKIN PROX 35W (STAPLE) ×3 IMPLANT
STEM MINI LONG 15MM 83MM (Stem) ×2 IMPLANT
SUT ETHIBOND 0 MO6 C/R (SUTURE) ×3 IMPLANT
SUT FIBERWIRE #2 38 BLUE 1/2 (SUTURE) ×12
SUT VIC AB 0 CT1 36 (SUTURE) ×3 IMPLANT
SUT VIC AB 2-0 CT1 27 (SUTURE) ×6
SUT VIC AB 2-0 CT1 TAPERPNT 27 (SUTURE) ×2 IMPLANT
SUTURE FIBERWR #2 38 BLUE 1/2 (SUTURE) ×4 IMPLANT
SYR 10ML LL (SYRINGE) ×3 IMPLANT
SYR 30ML LL (SYRINGE) IMPLANT
TIP FAN IRRIG PULSAVAC PLUS (DISPOSABLE) ×1 IMPLANT
TRAY HUM MINI SHOULDER +0 40D (Shoulder) ×2 IMPLANT
WRAPON POLAR PAD SHDR UNIV (MISCELLANEOUS) ×3

## 2019-05-30 NOTE — H&P (Signed)
Paper H&P to be scanned into permanent record. H&P reviewed and patient re-examined. No changes. 

## 2019-05-30 NOTE — Transfer of Care (Signed)
Immediate Anesthesia Transfer of Care Note  Patient: Tony Hinton  Procedure(s) Performed: Procedure(s): IRRIGATION AND DEBRIDEMENT SHOULDER (Left) REVERSE SHOULDER ARTHROPLASTY (Left)  Patient Location: PACU and Endoscopy Unit  Anesthesia Type:General  Level of Consciousness: sedated  Airway & Oxygen Therapy: Patient Spontanous Breathing and Patient connected to nasal cannula oxygen  Post-op Assessment: Report given to RN and Post -op Vital signs reviewed and stable  Post vital signs: Reviewed and stable  Last Vitals:  Vitals:   05/30/19 1301 05/30/19 1621  BP: (!) 154/85 (!) 152/92  Pulse:  85  Resp: 14 14  Temp:  36.6 C  SpO2: [id number]    Complications: No apparent anesthesia complications

## 2019-05-30 NOTE — Anesthesia Preprocedure Evaluation (Signed)
Anesthesia Evaluation  Patient identified by MRN, date of birth, ID band Patient awake    Reviewed: Allergy & Precautions, H&P , NPO status , reviewed documented beta blocker date and time   Airway Mallampati: II  TM Distance: >3 FB Neck ROM: limited    Dental  (+) Upper Dentures, Chipped   Pulmonary shortness of breath, asthma , COPD,  Moderate COPD on PFT    + decreased breath sounds      Cardiovascular Normal cardiovascular exam     Neuro/Psych    GI/Hepatic neg GERD  ,  Endo/Other  Hypothyroidism   Renal/GU      Musculoskeletal  (+) Arthritis ,   Abdominal   Peds  Hematology  (+) Blood dyscrasia, anemia ,   Anesthesia Other Findings Past Medical History: No date: Anemia No date: Arthritis No date: Asthma     Comment:  well controlled 2000: 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  1994: Cataract     Comment:  testicular No date: COPD (chronic obstructive pulmonary disease) (HCC) No date: Dyspnea     Comment:  chronic No date: Factor 5 Leiden mutation, heterozygous ([place]) No date: Hypothyroidism No date: Thyroid disease Past Surgical History: No date: APPENDECTOMY 09/01/2014: COLON SURGERY     Comment:  sbo 2015: COLONOSCOPY No date: EYE SURGERY; Bilateral     Comment:  corneal transplants bil and cataracts bil No date: KNEE SURGERY; Right No date: SHOULDER SURGERY; Left   Reproductive/Obstetrics                             Anesthesia Physical Anesthesia Plan  ASA: III  Anesthesia Plan: General   Post-op Pain Management:  Regional for Post-op pain   Induction: Intravenous  PONV Risk Score and Plan: 2 and Ondansetron, Dexamethasone, Midazolam and Treatment may vary due to age or medical condition  Airway Management Planned: Oral ETT  Additional Equipment:   Intra-op Plan:   Post-operative Plan: Extubation in OR  Informed Consent: I have reviewed the patients  History and Physical, chart, labs and discussed the procedure including the risks, benefits and alternatives for the proposed anesthesia with the patient or authorized representative who has indicated his/her understanding and acceptance.     Dental Advisory Given  Plan Discussed with: CRNA  Anesthesia Plan Comments:         Anesthesia Quick Evaluation

## 2019-05-30 NOTE — Progress Notes (Signed)
[place] for warfarin Indication: atrial fibrillation  Allergies  Allergen Reactions  . Penicillins     Does not believe he is allergic but does recall that it does not work on him.  . Sulfa Antibiotics Other (See Comments)    Effects eyes  . Shellfish Allergy Other (See Comments) and Rash    Effects his eyes    Vital Signs: Temp: 97.7 F (36.5 C) (09/10 1713) Temp Source: Temporal (09/10 1621) BP: 153/86 (09/10 1744) Pulse Rate: 70 (09/10 1744)  Labs: Recent Labs    05/30/19 1204  LABPROT 13.4  INR 1.0    Estimated Creatinine Clearance: 76.2 mL/min (by C-G formula based on SCr of 0.85 mg/dL).   Medical History: Past Medical History:  Diagnosis Date  . Anemia   . Arthritis   . Asthma    well controlled  . 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 COPD (chronic obstructive pulmonary disease) ([place])   . Dyspnea    chronic  . Factor 5 Leiden mutation, heterozygous ([place])   . Hypothyroidism   . Thyroid disease     Assessment: 78 year old male with h/o afib on warfarin. Patient taking 7 mg daily PTA. Patient is now s/p reverse left total shoulder arthroplasty. Pharmacy consulted for Dr. Roland Rack for warfarin dosing.  Date INR Dose 9/10 1.0  Goal of Therapy:  INR 2-3 Monitor platelets by anticoagulation protocol: Yes   Plan:  Warfarin 10 mg x 1 ordered by physician. Will continue with this dose and likely transition patient back to home regimen soon.  Tawnya Crook, PharmD 05/30/2019,6:08 PM

## 2019-05-30 NOTE — Op Note (Signed)
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  Procedure:   Reverse left total shoulder arthroplasty.  Surgeon:   Pascal Lux, MD  Assistant:   Cameron Proud, PA-C; Orland Penman, PA-S  Anesthesia:   General endotracheal with an interscalene block using Exparel placed preoperatively by the anesthesiologist.  Findings:   As above.  Complications:   None  EBL:   300 cc  Fluids:   1300 cc crystalloid  UOP:   None  TT:   None  Drains:   None  Closure:   Staples  Implants:   All press-fit Biomet Comprehensive system with a #15 micro-humeral stem, a 40 mm humeral tray with a standard insert, and a mini-base plate with a 40 mm glenosphere with 6 mm of lateral offset.  Brief Clinical Note:   The patient is a 78 year old male with a long history of progressively worsening left shoulder pain and weakness. 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 An MRI scan demonstrated significant synovitis with periarticular intermediate mixed signal masses and cystic changes, so a latent infection could not be ruled out, although cultures from a preoperative shoulder aspiration were negative. The patient presents at this time for a formal irrigation and debridement and possible reverse left total shoulder arthroplasty.  Procedure:   The patient underwent placement of an interscalene block using Exparel by the anesthesiologist in the preoperative holding area before being brought into the operating room and lain in the supine position. The patient then underwent general endotracheal  intubation and anesthesia before he was repositioned in the beach chair position using the beach chair positioner. The left shoulder and upper extremity were prepped with ChloraPrep solution before being draped sterilely. Preoperative antibiotics were administered.   A standard anterior approach to the shoulder was made through an approximately 4-5 inch incision. The incision was carried down through the subcutaneous tissues to expose the deltopectoral fascia. The interval between the deltoid and pectoralis muscles was identified and this plane developed, retracting the cephalic vein laterally with the deltoid muscle. The conjoined tendon was identified. Its lateral margin was dissected and the Kolbel self-retraining retractor inserted. The large soft tissue/cystic masses noted on the preoperative MRI scan were readily identified. A culture from some of the joint fluid was obtained, and tissue samples were sent off for additional cultures. In addition, additional tissue samples were sent off for frozen section to look for signs of acute inflammation. While waiting for the results of this test, the soft tissues were debrided thoroughly.  The joint was then irrigated thoroughly with sterile saline solution using the jet lavage system.   The pathologist called back stating that they were less than 5 neutrophils per high-powered field on the several tissue samples sent down, so it was elected to proceed with the reverse total shoulder arthroplasty. The subscapularis tendon remnant was identified and released from its attachment to the lesser tuberosity and several tagging sutures placed. The inferior capsule was released with care after identifying and protecting the axillary nerve. The proximal humeral cut was made at approximately 25 of retroversion using the extra-medullary guide.   Attention  was directed to the glenoid. The labrum was debrided circumferentially before the center of the glenoid was marked with  electrocautery. The guidewire was drilled into the glenoid neck using the appropriate guide. After verifying its position, it was overreamed with the mini-baseplate reamer to create a flat surface. The permanent mini-baseplate was impacted into place. It was stabilized with a 25 x 6.5 mm central screw and four peripheral screws. Locking screws were placed superiorly and inferiorly while nonlocking screws were placed anteriorly and posteriorly. The permanent 40 mm standard offset glenosphere was then impacted into place and its Morse taper locking mechanism verified using manual distraction.  Attention was directed to the humeral side. The humeral canal was reamed sequentially beginning with the end-cutting reamer then progressing from a 4 mm reamer up to a 15 mm reamer. This provided excellent circumferential chatter. The canal was broached beginning with a #12 broach and progressing to a #15 broach. This was left in place and a trial reduction performed using the standard trial humeral platform. The arm demonstrated excellent range of motion as the hand could be brought across the chest to the opposite shoulder and brought to the top of the patient's head and to the patient's ear.  Unfortunately, with abduction and external rotation, the shoulder appeared to hinge open, suggesting that there was some impingement posteriorly.  Therefore, the shoulder was redislocated and the glenosphere removed.  Additional soft tissues and prominent bone was removed from the posterior margin of the glenoid to reduce the likelihood of impingement.  In addition, it was elected to proceed with a +6 mm laterally offset 40 mm glenosphere.    This was trialed along with the standard humeral platform. The shoulder appeared stable throughout this range of motion. The joint was dislocated and the trial components removed. The permanent +6 mm laterally offset 40 mm glenosphere was impacted into place. Next, the permanent #15 micro-stem  was impacted into place with care taken to maintain the appropriate version. The permanent 40 mm humeral platform with the standard insert was put together on the back table and impacted into place. Again, the [facility] taper locking mechanism was verified using manual distraction. The shoulder was relocated using two finger pressure and again placed through a range of motion with the findings as described above.  The wound was copiously irrigated with bacitracin saline solution using the jet lavage system before a total of 30 cc of 0.5% Sensorcaine with epinephrine was injected into the pericapsular and peri-incisional tissues to help with postoperative analgesia. The subscapularis tendon was reapproximated using #2 FiberWire interrupted sutures. The deltopectoral interval was closed using #0 Vicryl interrupted sutures before the subcutaneous tissues were closed using 2-0 Vicryl interrupted sutures. The skin was closed using staples. Prior to closing the skin, 1 g of transexemic acid in 10 cc of normal saline was injected intra-articularly to help with postoperative bleeding. A sterile occlusive dressing was applied to the wound before the arm was placed into a shoulder immobilizer with an abduction pillow. A Polar Care system also was applied to the shoulder. The patient was then transferred back to a hospital bed before being awakened, extubated, and returned to the recovery room in satisfactory condition after tolerating the procedure well.

## 2019-05-30 NOTE — Anesthesia Procedure Notes (Signed)
Anesthesia Regional Block: Interscalene brachial plexus block   Pre-Anesthetic Checklist: ,, timeout performed, Correct Patient, Correct Site, Correct Laterality, Correct Procedure, Correct Position, site marked, Risks and benefits discussed,  Surgical consent,  Pre-op evaluation,  At surgeon's request and post-op pain management  Laterality: Left  Prep: alcohol swabs       Needles:  Injection technique: Single-shot  Needle Type: Echogenic Stimulator Needle     Needle Length: 10cm  Needle Gauge: 21   Needle insertion depth: 6 cm   Additional Needles:   Procedures: Doppler guided,,,, ultrasound used (permanent image in chart),,,,  Narrative:  Start time: 05/30/2019 12:51 PM End time: 05/30/2019 1:01 PM Injection made incrementally with aspirations every 5 mL.  Performed by: Personally  Anesthesiologist: Alphonsus Sias, MD  Additional Notes: Functioning IV was confirmed and monitors were applied. Mild IVS w O2NC.  A 170mm 21ga needle was used. Sterile prep and drape,hand hygiene and sterile gloves were used.  Negative aspiration and negative test dose prior to incremental administration of local anesthetic. The patient tolerated the procedure well without paraesthesias. Images (pre/post injection) stored

## 2019-05-30 NOTE — Anesthesia Post-op Follow-up Note (Signed)
Anesthesia QCDR form completed.        

## 2019-05-30 NOTE — Anesthesia Postprocedure Evaluation (Signed)
Anesthesia Post Note  Patient: Tony Hinton  Procedure(s) Performed: REVERSE SHOULDER ARTHROPLASTY (Left Shoulder)  Patient location during evaluation: PACU Anesthesia Type: General Level of consciousness: awake and alert Pain management: pain level controlled Vital Signs Assessment: post-procedure vital signs reviewed and stable Respiratory status: spontaneous breathing, nonlabored ventilation, respiratory function stable and patient connected to nasal cannula oxygen Cardiovascular status: blood pressure returned to baseline and stable Postop Assessment: no apparent nausea or vomiting Anesthetic complications: no     Last Vitals:  Vitals:   05/30/19 2055 05/30/19 2215  BP: (!) 151/96 (!) 147/92  Pulse: 61 64  Resp: 18 18  Temp: 36.4 C   SpO2: 96% 98%    Last Pain:  Vitals:   05/30/19 2055  TempSrc: Oral  PainSc:                  [name] S

## 2019-05-30 NOTE — Progress Notes (Signed)
PHARMACIST - PHYSICIAN ORDER COMMUNICATION  CONCERNING: P&T Medication Policy on Herbal Medications  DESCRIPTION:  This patient's order for:  Potassium 99 mg   has been noted.  This product(s) is classified as an "herbal" or natural product. Due to a lack of definitive safety studies or FDA approval, nonstandard manufacturing practices, plus the potential risk of unknown drug-drug interactions while on inpatient medications, the Pharmacy and Therapeutics Committee does not permit the use of "herbal" or natural products of this type within [facility].   ACTION TAKEN: The pharmacy department is unable to verify this order at this time and your patient has been informed of this safety policy. Please reevaluate patient's clinical condition at discharge and address if the herbal or natural product(s) should be resumed at that time.   

## 2019-05-30 NOTE — Anesthesia Procedure Notes (Signed)
Procedure Name: Intubation Performed by: Fredderick Phenix, CRNA Pre-anesthesia Checklist: Patient identified, Emergency Drugs available, Suction available and Patient being monitored Patient Re-evaluated:Patient Re-evaluated prior to induction Oxygen Delivery Method: Circle system utilized Preoxygenation: Pre-oxygenation with 100% oxygen Induction Type: IV induction Ventilation: Mask ventilation without difficulty Laryngoscope Size: Mac and 4 Grade View: Grade I Tube type: Oral Tube size: 7.0 mm Number of attempts: 1 Airway Equipment and Method: Stylet and Oral airway Placement Confirmation: ETT inserted through vocal cords under direct vision,  positive ETCO2 and breath sounds checked- equal and bilateral Tube secured with: Tape Dental Injury: Teeth and Oropharynx as per pre-operative assessment

## 2019-05-31 ENCOUNTER — Encounter: Payer: Self-pay | Admitting: Surgery

## 2019-05-31 LAB — CBC WITH DIFFERENTIAL/PLATELET
Abs Immature Granulocytes: 0.01 10*3/uL (ref 0.00–0.07)
Basophils Absolute: 0 10*3/uL (ref 0.0–0.1)
Basophils Relative: 1 %
Eosinophils Absolute: 0.3 10*3/uL (ref 0.0–0.5)
Eosinophils Relative: 8 %
HCT: 39.7 % (ref 39.0–52.0)
Hemoglobin: 12.2 g/dL — ABNORMAL LOW (ref 13.0–17.0)
Immature Granulocytes: 0 %
Lymphocytes Relative: 19 %
Lymphs Abs: 0.8 10*3/uL (ref 0.7–4.0)
MCH: 29.3 pg (ref 26.0–34.0)
MCHC: 30.7 g/dL (ref 30.0–36.0)
MCV: 95.4 fL (ref 80.0–100.0)
Monocytes Absolute: 0.5 10*3/uL (ref 0.1–1.0)
Monocytes Relative: 11 %
Neutro Abs: 2.6 10*3/uL (ref 1.7–7.7)
Neutrophils Relative %: 61 %
Platelets: 193 10*3/uL (ref 150–400)
RBC: 4.16 MIL/uL — ABNORMAL LOW (ref 4.22–5.81)
RDW: 14.5 % (ref 11.5–15.5)
WBC: 4.3 10*3/uL (ref 4.0–10.5)
nRBC: 0 % (ref 0.0–0.2)

## 2019-05-31 LAB — BASIC METABOLIC PANEL
Anion gap: 8 (ref 5–15)
BUN: 15 mg/dL (ref 8–23)
CO2: 29 mmol/L (ref 22–32)
Calcium: 8.8 mg/dL — ABNORMAL LOW (ref 8.9–10.3)
Chloride: 104 mmol/L (ref 98–111)
Creatinine, Ser: 0.78 mg/dL (ref 0.61–1.24)
GFR calc Af Amer: >60 mL/min (ref >60)
GFR calc non Af Amer: >60 mL/min (ref >60)
Glucose, Bld: 101 mg/dL — ABNORMAL HIGH (ref 70–99)
Potassium: 4 mmol/L (ref 3.5–5.1)
Sodium: 141 mmol/L (ref 135–145)

## 2019-05-31 LAB — PROTIME-INR
INR: 1 (ref 0.8–1.2)
Prothrombin Time: 13.4 seconds (ref 11.4–15.2)

## 2019-05-31 MED ORDER — OXYCODONE HCL 5 MG PO TABS: 5.0000 mg | ORAL_TABLET | ORAL | 0 refills | Status: DC | PRN

## 2019-05-31 MED ORDER — ENOXAPARIN SODIUM 40 MG/0.4ML ~~LOC~~ SOLN: 40.0000 mg | SUBCUTANEOUS | 0 refills | Status: DC

## 2019-05-31 MED ORDER — WARFARIN SODIUM 10 MG PO TABS
10.0000 mg | ORAL_TABLET | Freq: Once | ORAL | Status: DC
  Filled 2019-05-31: qty 1

## 2019-05-31 MED ORDER — TRAMADOL HCL 50 MG PO TABS: 50.0000 mg | ORAL_TABLET | Freq: Four times a day (QID) | ORAL | 0 refills | Status: DC | PRN

## 2019-05-31 NOTE — Evaluation (Signed)
Occupational Therapy Evaluation Patient Details Name: Tony Hinton MRN: [id number] DOB: Sep 24, 1940 Today's Date: 05/31/2019    History of Present Illness (P) Mr. Tony Hinton is a 78 y.o. male s/p L reverse TSA.   Clinical Impression   Mr. [name] was seen for an OT evaluation this date. Pt lives alone, but states his son will be staying with him to assist during recovery. Prior to surgery, pt was active and independent. 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 Pt has orders for his (dominant) LUE to be immobilized and will be NWBing per MD. Patient presents with impaired strength/ROM and sensation to LUE with block not completely resolved yet. These impairments result in a decreased ability to perform self care tasks requiring mod assist for UB/LB dressing and bathing and max assist for application of polar care, compression stockings, and sling/immobilizer. Pt instructed in polar care mgt, compression stockings mgt, sling/immobilizer mgt, ROM exercises for LUE (with instructions for no shoulder exercises until full sensation has returned), LUE precautions, adaptive strategies for bathing/dressing/toileting/grooming, positioning and considerations for sleep, and home/routines modifications to maximize falls prevention, safety, and independence. Handout provided. OT adjusted sling/immobilizer and polar care to improve comfort, optimize positioning, and to maximize skin integrity/safety. Pt verbalized understanding of all education/training provided. Pt will benefit from skilled OT services to address these limitations and improve independence in daily tasks. Recommend HHOT services to continue therapy to maximize return to PLOF, address home/routines modifications and safety, minimize falls risk, and minimize caregiver burden.       Follow Up Recommendations  Home health OT    Equipment Recommendations  3 in 1 bedside commode    Recommendations for Other Services       Precautions / Restrictions Precautions Precautions: (P) Shoulder Type of Shoulder Precautions: (P) Per secure message with Dr. Roland Rack on 05/31/19: "No weight-bearing to left upper extremity and PROM to tolerance." Shoulder Interventions: (P) Shoulder sling/immobilizer Precaution Booklet Issued: (P) Yes (comment) Precaution Comments: (P) Pt. received precaution booklet and appeared eager to perform exercises regularly Required Braces or Orthoses: (P) Sling Restrictions Weight Bearing Restrictions: (P) Yes LUE Weight Bearing: (P) Non weight bearing      Mobility Bed Mobility Overal bed mobility: Independent                Transfers Overall transfer level: Needs assistance Equipment used: None Transfers: Sit to/from Stand Sit to Stand: Min guard;Supervision;From elevated surface         General transfer comment: Pt requires push off from surface in order to come to standing.    Balance Overall balance assessment: Mild deficits observed, not formally tested                                         ADL either performed or assessed with clinical judgement   ADL Overall ADL's : Needs assistance/impaired Eating/Feeding: Sitting;Set up;Minimal assistance   Grooming: Set up;Minimal assistance;Standing   Upper Body Bathing: Minimal assistance;Sitting   Lower Body Bathing: Minimal assistance;Moderate assistance;Sitting/lateral leans   Upper Body Dressing : Sitting;Moderate assistance;Maximal assistance Upper Body Dressing Details (indicate cue type and reason): Max assist to don sling/immobilizer and polar care. Lower Body Dressing: Sit to/from stand;Minimal assistance Lower Body Dressing Details (indicate cue type and reason): Min assist to  pull up in back. Toilet Transfer: Regular Clinical cytogeneticist;Independent;Supervision/safety;Min guard   Toileting- Clothing Manipulation and Hygiene: Minimal  assistance;Sit to/from stand Toileting - Clothing Manipulation Details (indicate cue type and reason): When using room bathroom to urinate, pt pants fell down to ankles. Pt required min assist to pull them back up due to limited balance.     Functional mobility during ADLs: Min guard;Supervision/safety General ADL Comments: Pt ambulates quickly and with forward stooped posture. May benefit from AE to maximize safety.     Vision Baseline Vision/History: Wears glasses;Cataracts Wears Glasses: At all times Patient Visual Report: No change from baseline       Perception     Praxis      Pertinent Vitals/Pain Pain Assessment: No/denies pain(block still in place.)     Hand Dominance Left   Extremity/Trunk Assessment Upper Extremity Assessment Upper Extremity Assessment: LUE deficits/detail LUE Deficits / Details: Pt experiencing significant sensation and motor deficits in LUE. Pt has decreased sensation t/o the dorsal side of his forearm and is unable to extend his wrist, or digits 1-5. Further AROM not tested 2/2 shoulder precautions. MD notified. LUE: Unable to fully assess due to immobilization LUE Sensation: decreased light touch   Lower Extremity Assessment Lower Extremity Assessment: Overall WFL for tasks assessed;Defer to PT evaluation   Cervical / Trunk Assessment Cervical / Trunk Assessment: Kyphotic   Communication Communication Communication: No difficulties   Cognition Arousal/Alertness: Awake/alert Behavior During Therapy: WFL for tasks assessed/performed Overall Cognitive Status: Within Functional Limits for tasks assessed                                 General Comments: Pt mildly impulsive.   General Comments       Exercises Other Exercises Other Exercises: Pt educated in falls prevention strategies, safe use of AE for LB ADL, sling/immobilizer mangagment, polar care mgt, compression stocking mgt, and strategies for supporting ADLs including  bathing, dressing, and toileting upon DC home. Handout provided.   Shoulder Instructions      Home Living Family/patient expects to be discharged to:: (P) Private residence Living Arrangements: Alone Available Help at Discharge: Family(Son is coming to stay with pt during recovery) Type of Home: House Home Access: Stairs to enter [employer] of Steps: 2-3 Entrance Stairs-Rails: None Home Layout: One level     Bathroom Shower/Tub: Tub/shower unit         Home Equipment: Walker - 2 wheels   Additional Comments: Has grab bar for tub/shower but has not yet had it installed.      Prior Functioning/Environment Level of Independence: Independent        Comments: Pt reports total independence. Driving, working full time as a Surveyor, quantity.        OT Problem List: Decreased strength;Decreased coordination;Decreased range of motion;Decreased activity tolerance;Decreased safety awareness;Decreased knowledge of use of DME or AE;Impaired balance (sitting and/or standing);Decreased knowledge of precautions;Impaired UE functional use      OT Treatment/Interventions: Self-care/ADL training;Balance training;Therapeutic exercise;Therapeutic activities;DME and/or AE instruction;Patient/family education    OT Goals(Current goals can be found in the care plan section) Acute Rehab OT Goals Patient Stated Goal: To go home OT Goal Formulation: With patient Time For Goal Achievement: 06/14/19 Potential to Achieve Goals: Good  OT Frequency: Min 1X/week   Barriers to D/C: Inaccessible home environment          Co-evaluation  AM-PAC OT "6 Clicks" Daily Activity     Outcome Measure Help from another person eating meals?: A Little Help from another person taking care of personal grooming?: A Little Help from another person toileting, which includes using toliet, bedpan, or urinal?: A Little Help from another person bathing (including washing, rinsing, drying)?: A  Little Help from another person to put on and taking off regular upper body clothing?: A Lot Help from another person to put on and taking off regular lower body clothing?: A Lot 6 Click Score: 16   End of Session Equipment Utilized During Treatment: Gait belt Nurse Communication: Other (comment)(OT session complete, pt up in chair waiting for PT to start session.)  Activity Tolerance: Patient tolerated treatment well Patient left: in chair;with call bell/phone within reach;with chair alarm set  OT Visit Diagnosis: Other abnormalities of gait and mobility (R26.89);History of falling (Z91.81)                Time: [id number] OT Time Calculation (min): 73 min Charges:  OT General Charges $OT Visit: 1 Visit OT Evaluation $OT Eval Low Complexity: 1 Low OT Treatments $Self Care/Home Management : 53-67 mins  Shara Blazing, M.S., OTR/L Ascom: [phone] 05/31/19, 11:23 AM

## 2019-05-31 NOTE — Progress Notes (Signed)
[place] for warfarin Indication: atrial fibrillation  Allergies  Allergen Reactions  . Penicillins     Does not believe he is allergic but does recall that it does not work on him.  . Sulfa Antibiotics Other (See Comments)    Effects eyes  . Shellfish Allergy Other (See Comments) and Rash    Effects his eyes    Vital Signs: Temp: 97.9 F (36.6 C) (09/11 0408) Temp Source: Oral (09/11 0408) BP: 130/94 (09/11 0408) Pulse Rate: 68 (09/11 0408)  Labs: Recent Labs    05/30/19 1204  LABPROT 13.4  INR 1.0    Estimated Creatinine Clearance: 76.2 mL/min (by C-G formula based on SCr of 0.85 mg/dL).   Medical History: Past Medical History:  Diagnosis Date  . Anemia   . Arthritis   . Asthma    well controlled  . 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 COPD (chronic obstructive pulmonary disease) ([place])   . Dyspnea    chronic  . Factor 5 Leiden mutation, heterozygous ([place])   . Hypothyroidism   . Thyroid disease     Assessment: 78 year old male with h/o afib on warfarin. Patient taking 7 mg daily PTA. Patient is now s/p reverse left total shoulder arthroplasty. Pharmacy consulted for Dr. Roland Rack for warfarin dosing.  Date INR Dose 9/10 1.0       10 mg 9/11     1.0       10 mg  Goal of Therapy:  INR 2-3 Monitor platelets by anticoagulation protocol: Yes   Plan:  Will order warfarin 10 mg x 1 dose. Will continue with this dose and likely transition patient back to home regimen soon.  S/w PA, plan is to bridge with Lovenox until therapeutic on warfarin, as INR is currently subtherapeutic.  Gerald Dexter, PharmD 05/31/2019,7:31 AM

## 2019-05-31 NOTE — Plan of Care (Signed)

## 2019-05-31 NOTE — TOC Transition Note (Signed)
Transition of Care (TOC) - CM/SW Discharge Note   Patient Details  Name: Tony Hinton MRN: [id number] Date of Birth: 11/14/1940  Transition of Care (TOC) CM/SW Contact:  ,  M, LCSW Phone Number: [phone]  05/31/2019, 1:43 PM   Clinical Narrative: PT is recommending home health. Clinical Social Worker (CSW) met with patient to discuss D/C plan. Patient was alert and oriented X4 and was sitting up in the chair at bedside. CSW introduced self and explained role of CSW department. Per patient he lives in [place] alone however his son David is going to be staying with him. Patient reported that he has [facility] home [facility] already coming out to the house and would like to continue to use then. Per Cheryl [facility] home [facility] agency representative they can see patient on Monday 9/14. Patient is aware of above. Cheryl is aware that patient will D/C home today. CSW made patient aware that OT is recommending a bedside commode. Patient is agreeable to bedside commode. Brad [employer] agency representative delivered bedside commode to patient's room. Patient reported no other needs or concerns. Please reconsult if future social work needs arise. CSW signing off.     Final next level of care: Home w Home Health Services Barriers to Discharge: Barriers Resolved   Patient Goals and CMS Choice Patient states their goals for this hospitalization and ongoing recovery are:: To go home. CMS Medicare.gov Compare Post Acute Care list provided to:: Patient Choice offered to / list presented to : Patient  Discharge Placement                       Discharge Plan and Services In-house Referral: Clinical Social Work Discharge Planning Services: CM Consult Post Acute Care Choice: Home Health          DME Arranged: Bedside commode DME Agency: [employer] Date DME Agency Contacted: 05/31/19 Time DME Agency Contacted: 1223 Representative spoke with at DME Agency: Brad HH Arranged: PT,  OT HH Agency: [facility] Date HH Agency Contacted: 05/31/19 Time HH Agency Contacted: 1250 Representative spoke with at HH Agency: Cheryl  Social Determinants of Health (SDOH) Interventions     Readmission Risk Interventions No flowsheet data found.     

## 2019-05-31 NOTE — Discharge Summary (Signed)
Physician Discharge Summary  Patient ID: Tony Hinton MRN: [id number] DOB/AGE: Jun 25, 1941 78 y.o.  Admit date: 05/30/2019 Discharge date: 05/31/2019  Admission Diagnoses:  ROTATOR CUFF ARTHROPATHY, LEFT. NONTRAUMATIC COMPLETE TEAR OF LEFT ROTATOR CUFF.  Discharge Diagnoses: Patient Active Problem List   Diagnosis Date Noted  . 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 Supratherapeutic INR 04/10/2019  . Enlarged prostate with urinary retention 09/08/2014  . Small bowel obstruction due to adhesions ([place]) 09/08/2014   Past Medical History:  Diagnosis Date  . Anemia   . Arthritis   . Asthma    well controlled  . 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 COPD (chronic obstructive pulmonary disease) ([place])   . Dyspnea    chronic  . Factor 5 Leiden mutation, heterozygous ([place])   . Hypothyroidism   . Thyroid disease      Transfusion: None.   Consultants (if any):   Discharged Condition: Improved  Hospital Course: Tony Hinton is an 78 y.o. male who was admitted 05/30/2019 with a diagnosis of a massive irreparable rotator cuff tear with cuff arthropathy and extensive synovitis of the left shoulder and went to the operating room on 05/30/2019 and underwent the above named procedures.    Surgeries: Procedure(s): REVERSE SHOULDER ARTHROPLASTY on 05/30/2019 Patient tolerated the surgery well. Taken to PACU where she was stabilized and then transferred to the orthopedic floor.  Started on Lovenox 40mg  q 24 hrs to bridge while Warfarin dosing was changed per pharmacy recommendations to get PT/INR back to therapeutic levels.   Spoke with PCP, will discharge on home dose of warfarin and bridge with Lovenox 40mg  daily.  Foot pumps applied bilaterally at 80 mm. Heels elevated on bed with rolled towels. No evidence of DVT. Negative Homan.  Physical therapy started on day #1 for gait training and transfer. OT started day #1 for ADL and  assisted devices.  Patient's IV was removed on POD1.  Implants: All press-fit [employer] Comprehensive system with a #15 micro-humeral stem, a 40 mm humeral tray with a standard insert, and a mini-base plate with a 40 mm glenosphere with 6 mm of lateral offset.  He was given perioperative antibiotics:  Anti-infectives (From admission, onward)   Start     Dose/Rate Route Frequency Ordered Stop   05/30/19 1930  clindamycin (CLEOCIN) IVPB 600 mg     600 mg 100 mL/hr over 30 Minutes Intravenous Every 6 hours 05/30/19 1721 05/31/19 1149   05/30/19 1221  clindamycin (CLEOCIN) 900 MG/50ML IVPB    Note to Pharmacy: Dewayne Hatch   : cabinet override      05/30/19 1221 05/30/19 1328   05/30/19 0415  clindamycin (CLEOCIN) IVPB 900 mg     900 mg 100 mL/hr over 30 Minutes Intravenous  Once 05/30/19 0400 05/30/19 1328    .  He was given sequential compression devices, early ambulation, and Lovenox for DVT prophylaxis.  He benefited maximally from the hospital stay and there were no complications.    Recent vital signs:  Vitals:   05/31/19 0408 05/31/19 0813  BP: (!) 130/94 (!) 121/93  Pulse: 68 69  Resp: 18 18  Temp: 97.9 F (36.6 C) 97.8 F (36.6 C)  SpO2: 96% 98%    Recent laboratory studies:  Lab Results  Component Value Date   HGB 12.2 (L) 05/31/2019   HGB 12.3 (L) 05/21/2019   HGB 8.3 (L) 04/14/2019   Lab Results  Component Value Date   WBC 4.3 05/31/2019   PLT 193 05/31/2019   Lab Results  Component Value Date   INR 1.0 05/31/2019   Lab Results  Component Value Date   NA 141 05/31/2019   K 4.0 05/31/2019   CL 104 05/31/2019   CO2 29 05/31/2019   BUN 15 05/31/2019   CREATININE 0.78 05/31/2019   GLUCOSE 101 (H) 05/31/2019   Discharge Medications:   Allergies as of 05/31/2019      Reactions   Penicillins    Does not believe he is allergic but does recall that it does not work on him.   Sulfa Antibiotics Other (See Comments)   Effects eyes   Shellfish Allergy  Other (See Comments), Rash   Effects his eyes      Medication List    STOP taking these medications   HYDROcodone-acetaminophen 5-325 MG tablet Commonly known as: NORCO/VICODIN     TAKE these medications   allopurinol 100 MG tablet Commonly known as: ZYLOPRIM Take 100 mg by mouth every morning.   carboxymethylcellulose 0.5 % Soln Commonly known as: REFRESH PLUS Place 1 drop into both eyes 2 (two) times daily as needed (dry eyes).   cholecalciferol 25 MCG (1000 UT) tablet Commonly known as: VITAMIN D3 Take 1,000 Units by mouth daily.   desonide 0.05 % cream Commonly known as: DESOWEN Apply 1 application topically 2 (two) times daily as needed (irritation).   enoxaparin 40 MG/0.4ML injection Commonly known as: LOVENOX Inject 0.4 mLs (40 mg total) into the skin daily. Start taking on: September 12, [id number]   folic acid 1 MG tablet Commonly known as: FOLVITE Take 1 mg by mouth daily.   levothyroxine 75 MCG tablet Commonly known as: SYNTHROID Take 75 mcg by mouth daily before breakfast. Take on an empty stomach with a glass of water at least 30-60 minutes before breakfast   oxyCODONE 5 MG immediate release tablet Commonly known as: Oxy IR/ROXICODONE Take 1-2 tablets (5-10 mg total) by mouth every 4 (four) hours as needed for moderate pain.   Potassium 99 MG Tabs Take 99 mg by mouth daily.   Proventil HFA 108 (90 Base) MCG/ACT inhaler Generic drug: albuterol Inhale 1 puff into the lungs every 4 (four) hours as needed for wheezing or shortness of breath.   traMADol 50 MG tablet Commonly known as: ULTRAM Take 1 tablet (50 mg total) by mouth every 6 (six) hours as needed for moderate pain.   triamcinolone cream 0.1 % Commonly known as: KENALOG Apply 1 application topically 2 (two) times daily as needed (rash).   vitamin B-12 1000 MCG tablet Commonly known as: CYANOCOBALAMIN Take 1,000 mcg by mouth daily.   warfarin 5 MG tablet Commonly known as: COUMADIN Take 5  mg by mouth daily.   warfarin 1 MG tablet Commonly known as: COUMADIN Take 2 mg by mouth daily.   warfarin 4 MG tablet Commonly known as: COUMADIN Take 2 tablets (8 mg total) by mouth one time only at 6 PM.      Diagnostic Studies: Dg Shoulder Left Port  Result Date: 05/30/2019 CLINICAL DATA:  Reverse shoulder arthroplasty. EXAM: LEFT SHOULDER - 1 VIEW COMPARISON:  MRI of 03/29/2019. FINDINGS: Two views of the left shoulder demonstrate an arthroplasty, without acute complication. Visualized portion of the left hemithorax is normal. Surgical clips overlie the joint anteriorly. IMPRESSION: Expected appearance after left shoulder arthroplasty. Electronically Signed   By: Abigail Miyamoto M.D.   On: 05/30/2019 17:04   [place]  Or Nerve Block-image Only ([facility])  Result Date: 05/30/2019 There is no interpretation for this exam.  This order is for images obtained during a surgical procedure.  Please See "Surgeries" Tab for more information regarding the procedure.   Disposition: Plan for possible d/c home this afternoon on home dose of Warfarin and bridge with Lovenox.  Follow-up Information    Lattie Corns, PA-C Follow up in 14 day(s).   Specialty: Physician Assistant Why: Electa Sniff information: [place] [address] [phone]        Adin Hector, MD. Call.   Specialty: Internal Medicine Why: Call to schedule PT/INR check next week. Contact information: [place] [address] [phone]          Signed: Judson Roch PA-C 05/31/2019, 11:58 AM

## 2019-05-31 NOTE — Evaluation (Signed)
Physical Therapy Evaluation Patient Details Name: Tony Hinton MRN: [id number] DOB: 08/28/1941 Today's Date: 05/31/2019   History of Present Illness  Mr. Tony Hinton is a 78 y.o. male s/p L reverse TSA.  Clinical Impression  Mr. Tony Hinton is a 78 y/o M s/p L reverse TSA to address a L RCT. He was able to ambulate 200 ft with CGA and perform transfers with min assist; transfer surface was low. Ambulation revealed gait abnormalities including L hip drop, excessive toeing out bilaterally, and minor circumduction on L LE. Therapeutic exercises performed today required mod assist for gripping and max assist for finger extension and elbow movements. L UE deficits include strength, balance, and ROM. Pt will benefit from physical therapy to address these impairments to improve safety in the home and community. He plans to return to his private residence with temporary 24/7 assistance from his son.    Follow Up Recommendations Home health PT    Equipment Recommendations  None recommended by PT    Recommendations for Other Services       Precautions / Restrictions Precautions Precautions: Shoulder Type of Shoulder Precautions: Per secure message with Dr. Roland Rack on 05/31/19: "No weight-bearing to left upper extremity and PROM to tolerance." Shoulder Interventions: Shoulder sling/immobilizer;Shoulder abduction pillow Precaution Booklet Issued: Yes (comment) Precaution Comments: Pt. received precaution booklet and appeared eager to perform exercises regularly Required Braces or Orthoses: Other Brace(shoulder abduction brace ) Other Brace: shoulder abduction brace Restrictions Weight Bearing Restrictions: Yes LUE Weight Bearing: Non weight bearing      Mobility  Bed Mobility               General bed mobility comments: not performed as pt received in recliner  Transfers Overall transfer level: Needs assistance Equipment used: None Transfers: Sit to/from Stand Sit to Stand: Min  guard         General transfer comment: safe technique with improved transfer performed this session. Once standing, upright posture noted although still kyphotic  Ambulation/Gait Ambulation/Gait assistance: Min guard Gait Distance (Feet): 50 Feet Assistive device: None Gait Pattern/deviations: Step-through pattern     General Gait Details: ambulated in hallway to rehab gym. Needs cues for spacial orientation due to impulisive nature. No fatigue present. Distance limited secondary to RN getting ready to dc patient.  Stairs Stairs: Yes Stairs assistance: Min assist Stair Management: No rails Number of Stairs: 4 General stair comments: Pt ambulated up/down stairs with R railing with step over technique. Pt then performed 2nd attempt with HHA on R UE and no railing. STep to pattern performed with safe technique. Discussed performance for home discharge  Wheelchair Mobility    Modified Rankin (Stroke Patients Only)       Balance Overall balance assessment: Needs assistance Sitting-balance support: Feet supported Sitting balance-Leahy Scale: Good     Standing balance support: No upper extremity supported Standing balance-Leahy Scale: Fair                               Pertinent Vitals/Pain Pain Assessment: No/denies pain    Home Living Family/patient expects to be discharged to:: Private residence Living Arrangements: Alone Available Help at Discharge: Family(Son moving in temporarily to assist) Type of Home: House   Entrance Stairs-Rails: None Entrance Stairs-Number of Steps: 3 Home Layout: One level(pt. has one stair to enter living room level in home) Home Equipment: Gilford Rile - 2 wheels;Cane - single point Additional Comments: Pt. plans on  staying home following therapy with temporary assistance from his son 24/7    Prior Function Level of Independence: Independent         Comments: Pt. reports to function independently including hygeine  activities and work. Reports that he had one fall when bending over     Hand Dominance   Dominant Hand: Left    Extremity/Trunk Assessment   Upper Extremity Assessment Upper Extremity Assessment: LUE deficits/detail(RUE 5/5 ) LUE Deficits / Details: Pt. has apparent muscle weakness and lack of ROM and sensation in L UE following surgical procedure LUE: Unable to fully assess due to immobilization(able to form weak grip; difficulty wrist ext and elbow ROM) LUE Sensation: decreased light touch    Lower Extremity Assessment Lower Extremity Assessment: Overall WFL for tasks assessed(5/5 strength bilateral LE)    Cervical / Trunk Assessment Cervical / Trunk Assessment: Kyphotic  Communication   Communication: No difficulties  Cognition Arousal/Alertness: Awake/alert Behavior During Therapy: Impulsive Overall Cognitive Status: Within Functional Limits for tasks assessed                                        General Comments      Exercises Other Exercises Other Exercises: Pt educated in falls prevention strategies, safe use of AE for LB ADL, sling/immobilizer mangagment, polar care mgt, compression stocking mgt, and strategies for supporting ADLs including bathing, dressing, and toileting upon DC home. Handout provided.   Assessment/Plan    PT Assessment Patient needs continued PT services  PT Problem List         PT Treatment Interventions Gait training;Stair training;Therapeutic exercise;Balance training    PT Goals (Current goals can be found in the Care Plan section)  Acute Rehab PT Goals Patient Stated Goal: To go home PT Goal Formulation: With patient Time For Goal Achievement: 06/14/19 Potential to Achieve Goals: Good Additional Goals Additional Goal #1: Pt. will perform bed mobility, transfer, and ambulation activities with adequate guarding of L UE(to demonstrate safety and injury prevention)    Frequency BID   Barriers to discharge         Co-evaluation               AM-PAC PT "6 Clicks" Mobility  Outcome Measure Help needed turning from your back to your side while in a flat bed without using bedrails?: None Help needed moving from lying on your back to sitting on the side of a flat bed without using bedrails?: None Help needed moving to and from a bed to a chair (including a wheelchair)?: A Little Help needed standing up from a chair using your arms (e.g., wheelchair or bedside chair)?: A Little Help needed to walk in hospital room?: A Little Help needed climbing 3-5 steps with a railing? : A Little 6 Click Score: 20    End of Session Equipment Utilized During Treatment: Gait belt Activity Tolerance: Patient tolerated treatment well Patient left: in chair;with family/visitor present Nurse Communication: Mobility status PT Visit Diagnosis: Muscle weakness (generalized) (M62.81)    Time: [id number] PT Time Calculation (min) (ACUTE ONLY): 8 min   Charges:   PT Evaluation $PT Eval Low Complexity: 1 Low PT Treatments $Gait Training: 8-22 mins $Therapeutic Exercise: 8-22 mins        Jahaad Penado, SPT   Cornelia Walraven 05/31/2019, 3:14 PM

## 2019-05-31 NOTE — Progress Notes (Signed)
Physical Therapy Treatment Patient Details Name: Tony Hinton MRN: [id number] DOB: Jul 02, 1941 Today's Date: 05/31/2019    History of Present Illness Tony Hinton is a 78 y.o. male s/p L reverse TSA.    PT Comments    Pt is making good progress towards goals and is ready for dc this date. Stair training performed this session with education given for safety as pt still impulsive. Will progress as able. Further treatment deferred as pt ready for dc and ride/WC ready.   Follow Up Recommendations  Home health PT     Equipment Recommendations  None recommended by PT    Recommendations for Other Services       Precautions / Restrictions Precautions Precautions: Shoulder Type of Shoulder Precautions: Per secure message with Dr. Roland Rack on 05/31/19: "No weight-bearing to left upper extremity and PROM to tolerance." Shoulder Interventions: Shoulder sling/immobilizer;Shoulder abduction pillow Precaution Booklet Issued: Yes (comment) Precaution Comments: Pt. received precaution booklet and appeared eager to perform exercises regularly Required Braces or Orthoses: Other Brace(shoulder abduction brace ) Restrictions Weight Bearing Restrictions: Yes LUE Weight Bearing: Non weight bearing    Mobility  Bed Mobility Overal bed mobility: Independent             General bed mobility comments: not performed as pt received in recliner  Transfers Overall transfer level: Needs assistance Equipment used: None Transfers: Sit to/from Stand Sit to Stand: Min guard         General transfer comment: safe technique with improved transfer performed this session. Once standing, upright posture noted although still kyphotic  Ambulation/Gait Ambulation/Gait assistance: Min guard Gait Distance (Feet): 50 Feet Assistive device: None Gait Pattern/deviations: Step-through pattern     General Gait Details: ambulated in hallway to rehab gym. Needs cues for spacial orientation due to  impulisive nature. No fatigue present. Distance limited secondary to RN getting ready to dc patient.   Stairs Stairs: Yes Stairs assistance: Min assist Stair Management: No rails Number of Stairs: 4 General stair comments: Pt ambulated up/down stairs with R railing with step over technique. Pt then performed 2nd attempt with HHA on R UE and no railing. STep to pattern performed with safe technique. Discussed performance for home discharge   Wheelchair Mobility    Modified Rankin (Stroke Patients Only)       Balance Overall balance assessment: Needs assistance Sitting-balance support: Feet supported Sitting balance-Leahy Scale: Good     Standing balance support: No upper extremity supported Standing balance-Leahy Scale: Fair                              Cognition Arousal/Alertness: Awake/alert Behavior During Therapy: Impulsive Overall Cognitive Status: Within Functional Limits for tasks assessed                                 General Comments: Pt mildly impulsive.      Exercises Other Exercises Other Exercises: Pt educated in falls prevention strategies, safe use of AE for LB ADL, sling/immobilizer mangagment, polar care mgt, compression stocking mgt, and strategies for supporting ADLs including bathing, dressing, and toileting upon DC home. Handout provided.    General Comments        Pertinent Vitals/Pain Pain Assessment: No/denies pain    Home Living Family/patient expects to be discharged to:: Private residence Living Arrangements: Children Available Help at Discharge: Family Type of Home: House  Entrance Stairs-Rails: None Home Layout: One level(pt. has one stair to enter living room level in home) Home Equipment: Gilford Rile - 2 wheels;Cane - single point      Prior Function Level of Independence: Independent      Comments: Pt. reports to function independently including hygeine activities and work   PT Goals (current goals  can now be found in the care plan section) Acute Rehab PT Goals Patient Stated Goal: To go home Additional Goals Additional Goal #1: Pt. will perform bed mobility, transfer, and ambulation activities with adequate guarding of L UE(to demonstrate safety and injury prevention) Progress towards PT goals: Progressing toward goals    Frequency    BID      PT Plan Current plan remains appropriate    Co-evaluation              AM-PAC PT "6 Clicks" Mobility   Outcome Measure  Help needed turning from your back to your side while in a flat bed without using bedrails?: None Help needed moving from lying on your back to sitting on the side of a flat bed without using bedrails?: None Help needed moving to and from a bed to a chair (including a wheelchair)?: A Little Help needed standing up from a chair using your arms (e.g., wheelchair or bedside chair)?: A Little Help needed to walk in hospital room?: A Little Help needed climbing 3-5 steps with a railing? : A Little 6 Click Score: 20    End of Session Equipment Utilized During Treatment: Gait belt Activity Tolerance: Patient tolerated treatment well Patient left: in chair;with family/visitor present Nurse Communication: Mobility status PT Visit Diagnosis: Muscle weakness (generalized) (M62.81)     Time: [id number] PT Time Calculation (min) (ACUTE ONLY): 8 min  Charges:  $Gait Training: 8-22 mins $Therapeutic Exercise: 8-22 mins                     Tony Hinton, PT, DPT [phone]    Tony Hinton 05/31/2019, 3:01 PM

## 2019-05-31 NOTE — Progress Notes (Addendum)
  Subjective: 1 Day Post-Op Procedure(s) (LRB): REVERSE SHOULDER ARTHROPLASTY (Left) Patient reports pain as 0 on 0-10 scale.   Block still in effect for the left arm. Patient is well, and has had no acute complaints or problems Plan is to go Home after hospital stay. Negative for chest pain and shortness of breath Fever: no Gastrointestinal:Negative for nausea and vomiting  Objective: Vital signs in last 24 hours: Temp:  [97.5 F (36.4 C)-97.9 F (36.6 C)] 97.9 F (36.6 C) (09/11 0408) Pulse Rate:  [61-85] 68 (09/11 0408) Resp:  [11-18] 18 (09/11 0408) BP: (130-176)/(84-103) 130/94 (09/11 0408) SpO2:  [93 %-100 %] 96 % (09/11 0408)  Intake/Output from previous day:  Intake/Output Summary (Last 24 hours) at 05/31/2019 0806 Last data filed at 05/31/2019 0548 Gross per 24 hour  Intake 2470 ml  Output 1950 ml  Net 520 ml    Intake/Output this shift: No intake/output data recorded.  Labs: No results for input(s): HGB in the last 72 hours. No results for input(s): WBC, RBC, HCT, PLT in the last 72 hours. Recent Labs    05/31/19 0705  NA 141  K 4.0  CL 104  CO2 29  BUN 15  CREATININE 0.78  GLUCOSE 101*  CALCIUM 8.8*   Recent Labs    05/30/19 1204 05/31/19 0705  INR 1.0 1.0     EXAM General - Patient is Alert, Appropriate and Oriented Extremity - ABD soft Intact pulses distally Incision: dressing C/D/I No cellulitis present  Decreased sensation to light touch to the left arm this AM, able to move fingers.   Dressing/Incision - clean, dry, no drainage Motor Function - intact, moving foot and toes well on exam.   Past Medical History:  Diagnosis Date  . Anemia   . Arthritis   . Asthma    well controlled  . 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 COPD (chronic obstructive pulmonary disease) ([place])   . Dyspnea    chronic  . Factor 5 Leiden mutation, heterozygous ([place])   . Hypothyroidism   . Thyroid disease      Assessment/Plan: 1 Day Post-Op Procedure(s) (LRB): REVERSE SHOULDER ARTHROPLASTY (Left) Active Problems:   Status post reverse arthroplasty of shoulder, left  Estimated body mass index is 28.69 kg/m as calculated from the following:   Height as of 05/21/19: 5\' 7"  (1.702 m).   Weight as of 05/21/19: 83.1 kg. Advance diet Up with therapy D/C IV fluids when tolerating po intake.  Labs reviewed this AM. INR 1.0 this AM, pharmacy dosing.  Continue to monitor. Warfarin 10mg  dose given yesterday.  On 7mg  dose at home. Spoke with patient's PCP, they are comfortable with discharge home on Lovenox 40mg  daily and regular home dose of warfarin.  Check PT/INR in their office next week. Up with therapy today. Continue to monitor PT/INR, possible d/c home either this afternoon or tomorrow pending progress with PT.  DVT Prophylaxis - Coumadin, Foot Pumps and TED hose  Bridging with Lovenox. Non-weightbearing to the left arm.  Raquel , PA-C [facility] 05/31/2019, 8:06 AM

## 2019-05-31 NOTE — Progress Notes (Signed)
Pt discharged home , discharge instructions and next dose instructions explained to pt and his wife, hard rx given and educated on how to do lovenox injections and polar care. Left via personal vehical without incident

## 2019-05-31 NOTE — Discharge Instructions (Signed)
Diet: As you were doing prior to hospitalization   Shower:  May shower but keep the wounds dry, use an occlusive plastic wrap, NO SOAKING IN TUB.  If the bandage gets wet, change with a clean dry gauze.  Dressing:  You may change your dressing as needed. Change the dressing with sterile gauze dressing.    Activity:  Increase activity slowly as tolerated, but follow the weight bearing instructions below.  No lifting or driving for 6 weeks.  Weight Bearing:   Non-weightbearing to the left arm.  Blood Clot Prevention:  Take One Lovenox injection daily in addition to normal Warfarin dosing.  Schedule appointment with PCP next week (Dr. Caryl Comes) for PT/INR check and adjustment of medications at this time.  To prevent constipation: you may use a stool softener such as -  Colace (over the counter) 100 mg by mouth twice a day  Drink plenty of fluids (prune juice may be helpful) and high fiber foods Miralax (over the counter) for constipation as needed.    Itching:  If you experience itching with your medications, try taking only a single pain pill, or even half a pain pill at a time.  You may take up to 10 pain pills per day, and you can also use benadryl over the counter for itching or also to help with sleep.   Precautions:  If you experience chest pain or shortness of breath - call 911 immediately for transfer to the hospital emergency department!!  If you develop a fever greater that 101 F, purulent drainage from wound, increased redness or drainage from wound, or calf pain-Call [place]                                              Follow- Up Appointment:  Please call for an appointment to be seen in 2 weeks at [facility]

## 2019-06-03 LAB — SURGICAL PATHOLOGY

## 2019-06-04 LAB — AEROBIC/ANAEROBIC CULTURE W GRAM STAIN (SURGICAL/DEEP WOUND)
Culture: NO GROWTH
Gram Stain: NONE SEEN

## 2019-06-05 LAB — AEROBIC/ANAEROBIC CULTURE W GRAM STAIN (SURGICAL/DEEP WOUND)
Culture: NO GROWTH
Gram Stain: NONE SEEN

## 2019-06-18 ENCOUNTER — Encounter: Payer: Self-pay | Admitting: Physical Therapy

## 2019-06-18 ENCOUNTER — Other Ambulatory Visit: Payer: Self-pay

## 2019-06-18 ENCOUNTER — Ambulatory Visit: Payer: Medicare HMO | Attending: Student | Admitting: Physical Therapy

## 2019-06-18 DIAGNOSIS — M25612 Stiffness of left shoulder, not elsewhere classified: Secondary | ICD-10-CM | POA: Insufficient documentation

## 2019-06-18 DIAGNOSIS — M25512 Pain in left shoulder: Secondary | ICD-10-CM | POA: Diagnosis present

## 2019-06-18 DIAGNOSIS — M6281 Muscle weakness (generalized): Secondary | ICD-10-CM | POA: Diagnosis present

## 2019-06-18 NOTE — Therapy (Signed)
[name], [place], [address] Phone: [phone]   Fax:  [phone]  Physical Therapy Evaluation  Patient Details  Name: Tony Hinton MRN: [id number] Date of Birth: 1941/03/27 Referring Provider (PT): Poggi, Marshall Cork MD Lattie Corns PA   Encounter Date: 06/18/2019  PT End of Session - 06/18/19 1235    Visit Number  1    Number of Visits  26    Date for PT Re-Evaluation  09/10/19    Authorization Type  [employer] Medicare  KX modifier after 15  visits and progress note after 10th visit    Authorization - Visit Number  1    Authorization - Number of Visits  26    PT Start Time  [id number]    PT Stop Time  [id number]    PT Time Calculation (min)  50 min    Activity Tolerance  Patient tolerated treatment well    Behavior During Therapy  Impulsive       Past Medical History:  Diagnosis Date  . Anemia   . Arthritis   . Asthma    well controlled  . 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 COPD (chronic obstructive pulmonary disease) ([place])   . Dyspnea    chronic  . Factor 5 Leiden mutation, heterozygous ([place])   . Hypothyroidism   . Thyroid disease     Past Surgical History:  Procedure Laterality Date  . APPENDECTOMY    . COLON SURGERY  09/01/2014   sbo  . COLONOSCOPY  2015  . EYE SURGERY Bilateral    corneal transplants bil and cataracts bil  . KNEE SURGERY Right   . REVERSE SHOULDER ARTHROPLASTY Left 05/30/2019   Procedure: REVERSE SHOULDER ARTHROPLASTY;  Surgeon: Corky Mull, MD;  Location: [facility] ORS;  Service: Orthopedics;  Laterality: Left;  . SHOULDER SURGERY Left     There were no vitals filed for this visit.   Subjective Assessment - 06/18/19 1155    Subjective  Pt underwent rTSR of left shoulder.  Pt has had arthritis in shoulders for several years.  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  Pt son lives with him presently but he had been living  by himself.  Pt reports he fell about a year ago when he lost his balance.  Pt utilizes a quad cane for mobility in community    Pertinent History  Asthma, COPD, testicular CA, arthitis    Limitations  Lifting;House hold activities;Other (comment)   works in shop as a Furniture conservator/restorer   Patient Stated Goals  Be able to use my arm to return to work as a Furniture conservator/restorer,  Be able to drive    Currently in Pain?  Yes    Pain Score  2     Pain Location  Shoulder    Pain Orientation  Left    Pain Descriptors / Indicators  Aching    Pain Type  Surgical pain    Pain Onset  1 to 4 weeks ago    Pain Frequency  Occasional    Aggravating Factors   moving arm.    Pain Relieving Factors  ice         [facility] PT Assessment - 06/18/19 0001      Assessment   Medical Diagnosis  Reverse Total Shoulder Left    Referring Provider (PT)  Poggi, Marshall Cork MD Lattie Corns  PA    Onset Date/Surgical Date  05/30/19    Hand Dominance  Left    Next MD Visit  in a month    Prior Therapy  HHPT since DC from hospital      Precautions   Precautions  Shoulder    Type of Shoulder Precautions  total shoulder precautioans    Precaution Comments  Avoid IR with ADD with extension, No ER greater than 20-30   NO AROM 1st 6 weeks gradually restore at 6 weeks post op     Restrictions   Weight Bearing Restrictions  Yes    LUE Weight Bearing  Non weight bearing      Balance Screen   Has the patient fallen in the past 6 months  Yes    How many times?  1   bent over and lost balance a year ago   Has the patient had a decrease in activity level because of a fear of falling?   No    Is the patient reluctant to leave their home because of a fear of falling?   No      Home Environment   Living Environment  Private residence    Living Arrangements  Children    Type of [place] to enter    Entrance Stairs-Number of Steps  3    Entrance Stairs-Rails  None    Home Layout  One level      Prior Function    Level of Independence  Independent    Vocation  Full time employment    Vocation Requirements  works as a Furniture conservator/restorer now working      Charity fundraiser Status  Within [employer] for tasks assessed      Observation/Other Assessments   Focus on Therapeutic Outcomes (FOTO)   FOTO intake 53%, limitation 47% predicted 32%      Posture/Postural Control   Posture/Postural Control  Postural limitations    Postural Limitations  Rounded Shoulders;Forward head   LT> RT     ROM / Strength   AROM / PROM / Strength  PROM;Strength;AROM      AROM   Overall AROM   Deficits    Overall AROM Comments  No AROM of LT at this time.  PROM 1st 6 weeks,     Right Shoulder Flexion  140 Degrees    Right Shoulder ABduction  120 Degrees    Right Shoulder Internal Rotation  40 Degrees    Right Shoulder External Rotation  80 Degrees      PROM   Overall PROM   Deficits    Right Shoulder Flexion  147 Degrees    Right Shoulder ABduction  132 Degrees    Right Shoulder Internal Rotation  47 Degrees    Right Shoulder External Rotation  90 Degrees    Left Shoulder Flexion  75 Degrees    Left Shoulder ABduction  70 Degrees    Left Shoulder Internal Rotation  57 Degrees    Left Shoulder External Rotation  0 Degrees      Strength   Overall Strength  Deficits    Overall Strength Comments  left not tested due to precuatians and PROM on protocol      Palpation   Palpation comment  tenderness periscapular mx and left upper trap      Ambulation/Gait   Assistive device  Small based quad cane    Gait Pattern  Step-to  pattern    Ambulation Surface  Level    Gait Comments  quad cane adjusted for proper height  cane was too short and increased pt trunk flexion                Objective measurements completed on examination: See above findings.      [place] Adult PT Treatment/Exercise - 06/18/19 0001      Self-Care   Self-Care  Other Self-Care Comments;Posture    Posture  intiial  posture and positioning    Other Self-Care Comments   rTSR protocol and precautians      Shoulder Exercises: Supine   Other Supine Exercises  PROM of left shoulder flex, abd, ER to 0 this session      Shoulder Exercises: Seated   Other Seated Exercises  seated flex of LT arm on table with towel and elbow extension   10 x each      Shoulder Exercises: Standing   Other Standing Exercises  pendulum clock and  forward and side to side             PT Education - 06/18/19 1235    Education Details  POC Explanation of findings Initial posture, Precautians, initial pendulum exericises    Person(s) Educated  Patient    Methods  Explanation;Demonstration;Tactile cues;Verbal cues;Handout    Comprehension  Verbalized understanding;Returned demonstration       PT Short Term Goals - 06/18/19 1354      PT SHORT TERM GOAL #1   Title  Pt will be independent with initial HEP    Time  4    Period  Weeks    Status  New    Target Date  07/16/19      PT SHORT TERM GOAL #2   Title  Pt will be able to achieve 90 degrees flex/abd with AAROM    Baseline  flex 75/ abd 70  limited by tightness and pain    Time  4    Period  Weeks    Status  New    Target Date  07/16/19      PT SHORT TERM GOAL #3   Title  Pt will be able to verbalize precautians for rTSR    Time  4    Period  Weeks    Status  New    Target Date  07/16/19        PT Long Term Goals - 06/18/19 1235      PT LONG TERM GOAL #1   Title  Pt will be independent with advanced HEP    Baseline  no knowledge    Time  12    Period  Weeks    Status  New    Target Date  09/10/19      PT LONG TERM GOAL #2   Title  left shoulder AROM scaption will improve to 0-115 degrees for improved overhead reaching.    Baseline  flex PROM eval 75    Time  12    Period  Weeks    Status  New    Target Date  09/10/19      PT LONG TERM GOAL #3   Title  FOTO will improve from 47% limitation   to 32 % limitation     indicating improved  functional mobility.    Baseline  eval limiation 47%    Time  12    Period  Weeks    Status  New    Target  Date  09/10/19      PT LONG TERM GOAL #4   Title  Tolerate light resistance exercises in flexion and scaption with minimal pain    Time  12    Period  Weeks    Status  New    Target Date  09/10/19      PT LONG TERM GOAL #5   Title  [name] will be able to return to driving and manage steering wheel safely without exacerbating pain,    Baseline  unable to drive at this time due to precautians and no active motion of Left shoulder    Time  12    Period  Weeks    Status  New    Target Date  09/10/19      PT LONG TERM GOAL #6   Title  Pt will be able to use arms to operate  machinery safely as a Furniture conservator/restorer in order to return to his work    Baseline  unable to work now    Time  12    Period  Weeks    Status  New    Target Date  09/10/19             Plan - 06/18/19 1235    Clinical Impression Statement  78 yo male underwent rTSR of left shoulder on 05-30-19 with Dr Milagros Evener MD.  Pt has recieved HHPT but now is continuing care at [facility]. 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  Pt is almost 3 weeks post surgery and has impairments in limitied range, muscle strength, pain and therefore limited in functionalactivities and ADL's    Personal Factors and Comorbidities  Age;Comorbidity 1;Comorbidity 2;Fitness    Comorbidities  Asthma, COPD, testicular CA, arthitis    Examination-Activity Limitations  Carry;Dressing;Hygiene/Grooming;Self Feeding    Examination-Participation Restrictions  Cleaning;Laundry    Stability/Clinical Decision Making  Stable/Uncomplicated    Clinical Decision Making  Low    Rehab Potential  Good    PT Frequency  2x / week   3 x a week for first two weeks and then continuing 2 x  week   PT Duration  12 weeks    PT Treatment/Interventions  ADLs/Self Care Home  Management;Cryotherapy;Electrical Stimulation;Iontophoresis 4mg /ml Dexamethasone;Moist Heat;Ultrasound;Neuromuscular re-education;Therapeutic exercise;Therapeutic activities;Patient/family education;Manual techniques;Taping;Passive range of motion    PT Next Visit Plan  PROM, to 120 degrees, deltoid sub maximal isometrics, gentle resisted exerciase of elbow , wrist and hand    PT Home Exercise Plan  pendulum, elbow extension, seated shoulder flex on table with towel    Consulted and Agree with Plan of Care  Patient       Patient will benefit from skilled therapeutic intervention in order to improve the following deficits and impairments:     Visit Diagnosis: Stiffness of left shoulder, not elsewhere classified  Left shoulder pain, unspecified chronicity  Muscle weakness (generalized)     Problem List Patient Active Problem List   Diagnosis Date Noted  . 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 Supratherapeutic INR 04/10/2019  . Enlarged prostate with urinary retention 09/08/2014  . Small bowel obstruction due to adhesions [facility]) 09/08/2014   Voncille Lo, PT Certified Exercise Expert for the Aging Adult  06/18/19 2:36 PM Phone: [phone] Fax: [place] [facility] [address] [place], [place], [address] Phone: [phone]   Fax:  [phone]  Name: Tony Hinton MRN: [id number] Date of Birth: Feb 19, 1941

## 2019-06-18 NOTE — Patient Instructions (Signed)
Posture Tips DO: - stand tall and erect - keep chin tucked in - keep head and shoulders in alignment - check posture regularly in mirror or large window - pull head back against headrest in car seat;  Change your position often.  Sit with lumbar support. DON'T: - slouch or slump while watching TV or reading - sit, stand or lie in one position  for too long;  Sitting is especially hard on the spine so if you sit at a desk/use the computer, then stand up often!   Copyright  VHI. All rights reserved.  Posture - Standing   Good posture is important. Avoid slouching and forward head thrust. Maintain curve in low back and align ears over shoul- ders, hips over ankles.  Pull your belly button in toward your back bone. Lift breast bone up/ ribs up and chin down .  Take 5 deep breathes every 2-3 hours    Copyright  VHI. All rights reserved.  Posture - Sitting   Sit upright, head facing forward. Try using a roll to support lower back. Keep shoulders relaxed, and avoid rounded back. Keep hips level with knees. Avoid crossing legs for long periods. Sit on your sit bones not your tail bone.   Copyright  VHI. All rights reserved.    ROM: Pendulum (Circular)  Let right arm move in circle clockwise, then counterclockwise, by rocking body weight in circular pattern. Circle _10___ times each direction per set. Do _3___ sessions per day.  Pendulum Side to Side  Bend forward 90 at waist, leaning on table for support. Rock body from side to side and let arm swing freely. Repeat _10___ times. Do __3__ sessions per day.    AROM: Elbow Flexion / Extension  With left hand palm up, gently bend elbow as far as possible. Then straighten arm as far as possible. Repeat __10__ times per set. Do __3__ sessions per day.  SHOULDER: Flexion On Table  Place hands on table, elbows straight. Move hips away from body. Press hands down into table. Hold _3__ seconds. _10__ reps per set, __3_ sets per day.    Sit on your sit bones Tony Hinton, PT Certified Exercise Expert for the Aging Adult  06/18/19 12:31 PM Phone: [phone] Fax: [phone]

## 2019-06-20 ENCOUNTER — Other Ambulatory Visit: Payer: Self-pay

## 2019-06-20 ENCOUNTER — Ambulatory Visit: Payer: Medicare HMO | Attending: Student

## 2019-06-20 DIAGNOSIS — M25612 Stiffness of left shoulder, not elsewhere classified: Secondary | ICD-10-CM | POA: Diagnosis present

## 2019-06-20 DIAGNOSIS — M6281 Muscle weakness (generalized): Secondary | ICD-10-CM | POA: Insufficient documentation

## 2019-06-20 DIAGNOSIS — M25512 Pain in left shoulder: Secondary | ICD-10-CM | POA: Diagnosis present

## 2019-06-20 NOTE — Therapy (Signed)
Tony Hinton, [place], [address] Phone: [phone]   Fax:  [phone]  Physical Therapy Treatment  Patient Details  Name: Tony Hinton MRN: [id number] Date of Birth: January 11, 1941 Referring Provider (PT): Hinton, Tony Cork MD Tony Corns PA   Encounter Date: 06/20/2019  PT End of Session - 06/20/19 1000    Visit Number  2    Number of Visits  26    Date for PT Re-Evaluation  09/10/19    Authorization Type  [employer] Medicare  KX modifier after 15  visits and progress note after 10th visit    Authorization - Visit Number  2    Authorization - Number of Visits  26    PT Start Time  1000    PT Stop Time  1045    PT Time Calculation (min)  45 min    Activity Tolerance  Patient tolerated treatment well    Behavior During Therapy  Impulsive       Past Medical History:  Diagnosis Date  . Anemia   . Arthritis   . Asthma    well controlled  . 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 COPD (chronic obstructive pulmonary disease) ([place])   . Dyspnea    chronic  . Factor 5 Leiden mutation, heterozygous ([place])   . Hypothyroidism   . Thyroid disease     Past Surgical History:  Procedure Laterality Date  . APPENDECTOMY    . COLON SURGERY  09/01/2014   sbo  . COLONOSCOPY  2015  . EYE SURGERY Bilateral    corneal transplants bil and cataracts bil  . KNEE SURGERY Right   . REVERSE SHOULDER ARTHROPLASTY Left 05/30/2019   Procedure: REVERSE SHOULDER ARTHROPLASTY;  Surgeon: Corky Mull, MD;  Location: [facility] ORS;  Service: Orthopedics;  Laterality: Left;  . SHOULDER SURGERY Left     There were no vitals filed for this visit.  Subjective Assessment - 06/20/19 1003    Subjective  Limits no ER.Marland Kitchen Have been doing HEP. 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  No/denies                        [facility] Adult PT Treatment/Exercise - 06/20/19 0001      Shoulder Exercises: Supine   Other Supine Exercises  PROM of left shoulder flex, abd, ER to 5 this session      Shoulder Exercises: Seated   Elevation  Both;12 reps    Elevation Limitations  scapula elevation and depression    Other Seated Exercises  reviewed HEP  ( not table slides.     Other Seated Exercises  isometrics x 10 reps flex /abduction and extension keeping shoulder in scapula plane. . Also did 5 reps in standing at door frame      Manual Therapy   Manual Therapy  Passive ROM    Passive ROM  PROM flexon to 90 degrees ER to 5 degrees  gentle abduction to 45 degrees               PT Short Term Goals - 06/18/19 1354      PT SHORT TERM GOAL #1   Title  Pt will be independent with initial HEP  Time  4    Period  Weeks    Status  New    Target Date  07/16/19      PT SHORT TERM GOAL #2   Title  Pt will be able to achieve 90 degrees flex/abd with AAROM    Baseline  flex 75/ abd 70  limited by tightness and pain    Time  4    Period  Weeks    Status  New    Target Date  07/16/19      PT SHORT TERM GOAL #3   Title  Pt will be able to verbalize precautians for rTSR    Time  4    Period  Weeks    Status  New    Target Date  07/16/19        PT Long Term Goals - 06/18/19 1235      PT LONG TERM GOAL #1   Title  Pt will be independent with advanced HEP    Baseline  no knowledge    Time  12    Period  Weeks    Status  New    Target Date  09/10/19      PT LONG TERM GOAL #2   Title  left shoulder AROM scaption will improve to 0-115 degrees for improved overhead reaching.    Baseline  flex PROM eval 75    Time  12    Period  Weeks    Status  New    Target Date  09/10/19      PT LONG TERM GOAL #3   Title  FOTO will improve from 47% limitation   to 32 % limitation     indicating improved functional mobility.    Baseline  eval limiation 47%    Time  12    Period   Weeks    Status  New    Target Date  09/10/19      PT LONG TERM GOAL #4   Title  Tolerate light resistance exercises in flexion and scaption with minimal pain    Time  12    Period  Weeks    Status  New    Target Date  09/10/19      PT LONG TERM GOAL #5   Title  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 Did not advance HEP. Add isometrics next week if no issues from today.     flexion to about 90 degrees today.    PT Treatment/Interventions  ADLs/Self Care Home Management;Cryotherapy;Electrical Stimulation;Iontophoresis 4mg /ml Dexamethasone;Moist Heat;Ultrasound;Neuromuscular re-education;Therapeutic exercise;Therapeutic activities;Patient/family education;Manual techniques;Taping;Passive  range of motion    PT Next Visit Plan  PROM, to 120 degrees, deltoid sub maximal isometrics, gentle resisted exerciase of elbow , wrist and hand    PT Home Exercise Plan  pendulum, elbow extension, seated shoulder flex on table with towel    Consulted and Agree with Plan of Care  Patient       Patient will benefit from skilled therapeutic intervention in order to improve the following deficits and impairments:  Postural dysfunction, Pain, Impaired UE functional use, Decreased activity tolerance, Decreased range of motion, Decreased strength  Visit Diagnosis: Stiffness of left shoulder, not elsewhere classified  Left  shoulder pain, unspecified chronicity  Muscle weakness (generalized)     Problem List Patient Active Problem List   Diagnosis Date Noted  . 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 Supratherapeutic INR 04/10/2019  . Enlarged prostate with urinary retention 09/08/2014  . Small bowel obstruction due to adhesions [facility]) 09/08/2014    Darrel Hoover  PT 06/20/2019, 11:02 AM  [facility] [address] [place], [place], [address] Phone: [phone]   Fax:  [phone]  Name: Tony Hinton MRN: [id number] Date of Birth: 11/07/1940

## 2019-06-25 ENCOUNTER — Other Ambulatory Visit: Payer: Self-pay

## 2019-06-25 ENCOUNTER — Ambulatory Visit: Payer: Medicare HMO | Admitting: Physical Therapy

## 2019-06-25 ENCOUNTER — Encounter: Payer: Self-pay | Admitting: Physical Therapy

## 2019-06-25 DIAGNOSIS — M25612 Stiffness of left shoulder, not elsewhere classified: Secondary | ICD-10-CM | POA: Diagnosis not present

## 2019-06-25 DIAGNOSIS — M25512 Pain in left shoulder: Secondary | ICD-10-CM

## 2019-06-25 DIAGNOSIS — M6281 Muscle weakness (generalized): Secondary | ICD-10-CM

## 2019-06-25 NOTE — Therapy (Signed)
[place], [place], [address] Phone: [phone]   Fax:  [phone]  Physical Therapy Treatment  Patient Details  Name: Tony Hinton MRN: [id number] Date of Birth: November 06, 1940 Referring Provider (PT): Poggi, Marshall Cork MD Lattie Corns PA   Encounter Date: 06/25/2019  PT End of Session - 06/25/19 1036    Visit Number  3    Number of Visits  26    Date for PT Re-Evaluation  09/10/19    Authorization Type  [employer]na Medicare  KX modifier after 15  visits and progress note after 10th visit    Authorization - Visit Number  3    Authorization - Number of Visits  26    PT Start Time  1017    PT Stop Time  1100    PT Time Calculation (min)  43 min    Activity Tolerance  Patient tolerated treatment well    Behavior During Therapy  [facility] for tasks assessed/performed       Past Medical History:  Diagnosis Date  . Anemia   . Arthritis   . Asthma    well controlled  . 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 COPD (chronic obstructive pulmonary disease) ([place])   . Dyspnea    chronic  . Factor 5 Leiden mutation, heterozygous ([place])   . Hypothyroidism   . Thyroid disease     Past Surgical History:  Procedure Laterality Date  . APPENDECTOMY    . COLON SURGERY  09/01/2014   sbo  . COLONOSCOPY  2015  . EYE SURGERY Bilateral    corneal transplants bil and cataracts bil  . KNEE SURGERY Right   . REVERSE SHOULDER ARTHROPLASTY Left 05/30/2019   Procedure: REVERSE SHOULDER ARTHROPLASTY;  Surgeon: Corky Mull, MD;  Location: [facility] ORS;  Service: Orthopedics;  Laterality: Left;  . SHOULDER SURGERY Left     There were no vitals filed for this visit.  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  No/denies                       [facility] Adult PT Treatment/Exercise - 06/25/19 0001      Shoulder Exercises: Supine   Other Supine Exercises  PROM to left shld flex, abd, ER to 20 degrees,       Shoulder Exercises: Seated   Elevation  Both;12 reps    Elevation Limitations  scapula elevation and depression    Other Seated Exercises  isometrics EXT, flex, ER and ADD sub maximal 2 x 10 all standing with pushing in to towel   2lb resisted wrist flex/ ext x2 x 10 and then elbow flex 2 lb 2 x 10      Manual Therapy   Manual Therapy  Passive ROM    Passive ROM  PROM flexon to 100degrees ER to 15 degrees  gentle abduction to 70 degrees             PT Education -  06/25/19 1030    Education Details  Added isometrics /sub maximal and resisted wrist and elbow exercises    Person(s) Educated  Patient    Methods  Explanation;Demonstration;Tactile cues;Verbal cues;Handout    Comprehension  Verbalized understanding;Returned demonstration       PT Short Term Goals - 06/18/19 1354      PT SHORT TERM GOAL #1   Title  Pt will be independent with initial HEP    Time  4    Period  Weeks    Status  New    Target Date  07/16/19      PT SHORT TERM GOAL #2   Title  Pt will be able to achieve 90 degrees flex/abd with AAROM    Baseline  flex 75/ abd 70  limited by tightness and pain    Time  4    Period  Weeks    Status  New    Target Date  07/16/19      PT SHORT TERM GOAL #3   Title  Pt will be able to verbalize precautians for rTSR    Time  4    Period  Weeks    Status  New    Target Date  07/16/19        PT Long Term Goals - 06/18/19 1235      PT LONG TERM GOAL #1   Title  Pt will be independent with advanced HEP    Baseline  no knowledge    Time  12    Period  Weeks    Status  New    Target Date  09/10/19       PT LONG TERM GOAL #2   Title  left shoulder AROM scaption will improve to 0-115 degrees for improved overhead reaching.    Baseline  flex PROM eval 75    Time  12    Period  Weeks    Status  New    Target Date  09/10/19      PT LONG TERM GOAL #3   Title  FOTO will improve from 47% limitation   to 32 % limitation     indicating improved functional mobility.    Baseline  eval limiation 47%    Time  12    Period  Weeks    Status  New    Target Date  09/10/19      PT LONG TERM GOAL #4   Title  Tolerate light resistance exercises in flexion and scaption with minimal pain    Time  12    Period  Weeks    Status  New    Target Date  09/10/19      PT LONG TERM GOAL #5   Title  Mr. 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  Pt added sub maximal isometrics to the HEP, flexion to 100, ER to 15 and abd to 70.  Pt very compliant with HEP.  Will continue protocol for sub maximal isometrics for next 2 weeks and to continue PROM by PT    Personal Factors and Comorbidities  Age;Comorbidity 1;Comorbidity 2;Fitness    Comorbidities  Asthma, COPD, testicular CA, arthitis    Examination-Activity Limitations  Carry;Dressing;Hygiene/Grooming;Self Feeding    Examination-Participation Restrictions  Cleaning;Laundry    Stability/Clinical Decision Making  Stable/Uncomplicated    Clinical Decision Making  Low    Rehab Potential  Good     PT Frequency  2x / week    PT Duration  12 weeks    PT Treatment/Interventions  ADLs/Self Care Home Management;Cryotherapy;Electrical Stimulation;Iontophoresis 4mg /ml Dexamethasone;Moist Heat;Ultrasound;Neuromuscular re-education;Therapeutic exercise;Therapeutic activities;Patient/family education;Manual techniques;Taping;Passive range of motion    PT Next Visit Plan  PROM, to 120 degrees, deltoid sub maximal isometrics, gentle resisted exerciase of elbow , wrist and hand  Manual for supscapularis and periscapular muscle   PT Home Exercise Plan  pendulum, elbow extension, seated shoulder flex on table with towel sub maximal isometrics of shld, with towel into wall, flex/ext, ER and ADD    Consulted and Agree with Plan of Care  Patient       Patient will benefit from skilled therapeutic intervention in order to improve the following deficits and impairments:  Postural dysfunction, Pain, Impaired UE functional use, Decreased activity tolerance, Decreased range of motion, Decreased strength  Visit Diagnosis: Stiffness of left shoulder, not elsewhere classified  Left shoulder pain, unspecified chronicity  Muscle weakness (generalized)     Problem List Patient Active Problem List   Diagnosis Date Noted  . 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 Supratherapeutic INR 04/10/2019  . Enlarged prostate with urinary retention 09/08/2014  . Small bowel obstruction due to adhesions [facility]) 09/08/2014    Voncille Lo, PT Certified Exercise Expert for the Aging Adult  06/25/19 1:41 PM Phone: [phone] Fax: [place] [facility] [address] [place], [place], [address] Phone: [phone]   Fax:  [phone]  Name: Tony Hinton MRN: [id number] Date of Birth: 01-01-1941

## 2019-06-25 NOTE — Patient Instructions (Signed)
               Voncille Lo, PT Certified Exercise Expert for the Aging Adult  06/25/19 10:45 AM Phone: [phone] Fax: [phone]

## 2019-06-26 ENCOUNTER — Ambulatory Visit: Payer: Medicare HMO | Admitting: Physical Therapy

## 2019-06-26 DIAGNOSIS — M25612 Stiffness of left shoulder, not elsewhere classified: Secondary | ICD-10-CM | POA: Diagnosis not present

## 2019-06-26 DIAGNOSIS — M25512 Pain in left shoulder: Secondary | ICD-10-CM

## 2019-06-26 DIAGNOSIS — M6281 Muscle weakness (generalized): Secondary | ICD-10-CM

## 2019-06-26 NOTE — Therapy (Signed)
[place], [place], [address] Phone: [phone]   Fax:  [phone]  Physical Therapy Treatment  Patient Details  Name: Tony Hinton MRN: [id number] Date of Birth: 1941-01-08 Referring Provider (PT): Poggi, Marshall Cork MD Lattie Corns PA   Encounter Date: 06/26/2019  PT End of Session - 06/26/19 1214    Visit Number  4    Number of Visits  26    Date for PT Re-Evaluation  09/10/19    Authorization Type  [employer]na Medicare  KX modifier after 15  visits and progress note after 10th visit    Authorization - Visit Number  4    Authorization - Number of Visits  26    PT Start Time  1130    PT Stop Time  1215    PT Time Calculation (min)  45 min    Activity Tolerance  Patient tolerated treatment well    Behavior During Therapy  [facility] for tasks assessed/performed       Past Medical History:  Diagnosis Date  . Anemia   . Arthritis   . Asthma    well controlled  . 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 COPD (chronic obstructive pulmonary disease) ([place])   . Dyspnea    chronic  . Hinton 5 Leiden mutation, heterozygous ([place])   . Hypothyroidism   . Thyroid disease     Past Surgical History:  Procedure Laterality Date  . APPENDECTOMY    . COLON SURGERY  09/01/2014   sbo  . COLONOSCOPY  2015  . EYE SURGERY Bilateral    corneal transplants bil and cataracts bil  . KNEE SURGERY Right   . REVERSE SHOULDER ARTHROPLASTY Left 05/30/2019   Procedure: REVERSE SHOULDER ARTHROPLASTY;  Surgeon: Corky Mull, MD;  Location: [facility] ORS;  Service: Orthopedics;  Laterality: Left;  . SHOULDER SURGERY Left     There were no vitals filed for this visit.  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,  Be able to drive    Pain Onset  1 to 4 weeks ago          [facility] Adult PT Treatment/Exercise - 06/26/19 0001      Shoulder Exercises: Supine   Other Supine Exercises  PROM to left shld flex, abd, ER to 20 degrees,       Shoulder Exercises: Seated   Elevation Limitations  scapula elevation and depression    Other Seated Exercises  seated gripping green Dgrip 2x20, elbow flexion and supination/pronation 2 lbs X 20    Other Seated Exercises  isometrics EXT, flex, ER, abd and ADD sub maximal 10 reps  X6 sec each all standing with pushing in to towel   2lb resisted wrist flex/ ext x2 x 10 and then elbow flex 2 lb 2 x 10      Manual Therapy   Manual Therapy  Passive ROM    Passive ROM  PROM flexon to 100degrees ER to 15 degrees  gentle abduction to 70 degrees        PT Short Term Goals - 06/18/19 1354      PT  SHORT TERM GOAL #1   Title  Pt will be independent with initial HEP    Time  4    Period  Weeks    Status  New    Target Date  07/16/19      PT SHORT TERM GOAL #2   Title  Pt will be able to achieve 90 degrees flex/abd with AAROM    Baseline  flex 75/ abd 70  limited by tightness and pain    Time  4    Period  Weeks    Status  New    Target Date  07/16/19      PT SHORT TERM GOAL #3   Title  Pt will be able to verbalize precautians for rTSR    Time  4    Period  Weeks    Status  New    Target Date  07/16/19        PT Long Term Goals - 06/18/19 1235      PT LONG TERM GOAL #1   Title  Pt will be independent with advanced HEP    Baseline  no knowledge    Time  12    Period  Weeks    Status  New    Target Date  09/10/19      PT LONG TERM GOAL #2   Title  left shoulder AROM scaption will improve to 0-115 degrees for improved overhead reaching.    Baseline  flex PROM eval 75    Time  12    Period  Weeks    Status  New    Target Date  09/10/19      PT LONG TERM GOAL #3   Title  FOTO will improve from 47%  limitation   to 32 % limitation     indicating improved functional mobility.    Baseline  eval limiation 47%    Time  12    Period  Weeks    Status  New    Target Date  09/10/19      PT LONG TERM GOAL #4   Title  Tolerate light resistance exercises in flexion and scaption with minimal pain    Time  12    Period  Weeks    Status  New    Target Date  09/10/19      PT LONG TERM GOAL #5   Title  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    Personal  Factors and Comorbidities  Age;Comorbidity 1;Comorbidity 2;Fitness    Comorbidities  Asthma, COPD, testicular CA, arthitis    Examination-Activity Limitations  Carry;Dressing;Hygiene/Grooming;Self Feeding    Examination-Participation Restrictions  Cleaning;Laundry    Stability/Clinical Decision Making  Stable/Uncomplicated    Rehab Potential  Good    PT Frequency  2x / week    PT Duration  12 weeks    PT Treatment/Interventions  ADLs/Self Care Home Management;Cryotherapy;Electrical Stimulation;Iontophoresis 4mg /ml Dexamethasone;Moist Heat;Ultrasound;Neuromuscular re-education;Therapeutic exercise;Therapeutic activities;Patient/family education;Manual techniques;Taping;Passive range of motion    PT Next Visit Plan   PROM, to 120 degrees, deltoid sub maximal isometrics, gentle resisted exerciase of elbow , wrist and hand    PT Home Exercise Plan  pendulum, elbow extension, seated shoulder flex on table with towel sub maximal isometrics of shld, with towel into wall, flex/ext, ER and ADD    Consulted and Agree with Plan of Care  Patient       Patient will benefit from skilled therapeutic intervention in order to improve the following deficits and impairments:  Postural dysfunction, Pain, Impaired UE functional use, Decreased activity tolerance, Decreased range of motion, Decreased strength  Visit Diagnosis: Stiffness of left shoulder, not elsewhere classified  Left shoulder pain, unspecified chronicity  Muscle weakness (generalized)     Problem List Patient Active Problem List   Diagnosis Date Noted  . 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 Supratherapeutic INR 04/10/2019  . Enlarged prostate with urinary retention 09/08/2014  . Small bowel obstruction due to adhesions [facility]) 09/08/2014    Silvestre Mesi 06/26/2019, 12:25 PM  [facility] [address] [place], [place], [address] Phone: [phone]   Fax:  [phone]  Name: ZYERE ZUK MRN: [id number] Date of Birth: 1941/08/31

## 2019-06-28 ENCOUNTER — Other Ambulatory Visit: Payer: Self-pay

## 2019-06-28 ENCOUNTER — Ambulatory Visit: Payer: Medicare HMO | Admitting: Physical Therapy

## 2019-06-28 DIAGNOSIS — M25612 Stiffness of left shoulder, not elsewhere classified: Secondary | ICD-10-CM | POA: Diagnosis not present

## 2019-06-28 DIAGNOSIS — M25512 Pain in left shoulder: Secondary | ICD-10-CM

## 2019-06-28 DIAGNOSIS — M6281 Muscle weakness (generalized): Secondary | ICD-10-CM

## 2019-06-28 NOTE — Therapy (Signed)
[place], [place], [address] Phone: [phone]   Fax:  [phone]  Physical Therapy Treatment  Patient Details  Name: Tony Hinton MRN: [id number] Date of Birth: Nov 30, 1940 Referring Provider (PT): Poggi, Marshall Cork MD Lattie Corns PA   Encounter Date: 06/28/2019  PT End of Session - 06/28/19 1133    Visit Number  5    Number of Visits  26    Date for PT Re-Evaluation  09/10/19    Authorization Type  Aetna Medicare  KX modifier after 15  visits and progress note after 10th visit    Authorization - Visit Number  5    Authorization - Number of Visits  26    PT Start Time  1045    PT Stop Time  1130    PT Time Calculation (min)  45 min    Activity Tolerance  Patient tolerated treatment well    Behavior During Therapy  [facility] for tasks assessed/performed       Past Medical History:  Diagnosis Date  . Anemia   . Arthritis   . Asthma    well controlled  . 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 COPD (chronic obstructive pulmonary disease) ([place])   . Dyspnea    chronic  . Factor 5 Leiden mutation, heterozygous ([place])   . Hypothyroidism   . Thyroid disease     Past Surgical History:  Procedure Laterality Date  . APPENDECTOMY    . COLON SURGERY  09/01/2014   sbo  . COLONOSCOPY  2015  . EYE SURGERY Bilateral    corneal transplants bil and cataracts bil  . KNEE SURGERY Right   . REVERSE SHOULDER ARTHROPLASTY Left 05/30/2019   Procedure: REVERSE SHOULDER ARTHROPLASTY;  Surgeon: Corky Mull, MD;  Location: [facility] ORS;  Service: Orthopedics;  Laterality: Left;  . SHOULDER SURGERY Left     There were no vitals filed for this visit.  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,  Be able to drive    Pain Onset  1 to 4 weeks ago                       [facility] Adult PT Treatment/Exercise - 06/28/19 0001      Shoulder Exercises: Supine   Other Supine Exercises  PROM to left shld flex, abd, ER to 20 degrees,       Shoulder Exercises: Seated   Elevation Limitations  scapula elevation and depression    Other Seated Exercises  seated gripping green Dgrip 2x25, elbow flexion and supination/pronation 3 lbs X 20, table slide PROM flexion, abd, ER X 15 reps each very gentle    Other Seated Exercises  isometrics EXT, flex, ER, abd and ADD sub maximal 10 reps  X6 sec each all standing with pushing in to towel   2lb resisted wrist flex/ ext x2 x 10 and then elbow flex 2 lb 2 x 10      Manual Therapy   Manual Therapy  Passive ROM    Passive ROM  PROM flexon to 100degrees ER to 15 degrees  gentle abduction to 70 degrees             PT Education - 06/28/19 1133    Education Details  added table slides into HEP    Person(s) Educated  Patient    Methods  Explanation;Demonstration;Verbal cues;Handout    Comprehension  Verbalized understanding;Returned demonstration       PT Short Term Goals - 06/18/19 1354      PT SHORT TERM GOAL #1   Title  Pt will be independent with initial HEP    Time  4    Period  Weeks    Status  New    Target Date  07/16/19      PT SHORT TERM GOAL #2   Title  Pt will be able to achieve 90 degrees flex/abd with AAROM    Baseline  flex 75/ abd 70  limited by tightness and pain    Time  4    Period  Weeks    Status  New    Target Date  07/16/19      PT SHORT TERM GOAL #3   Title  Pt will be able to verbalize precautians for rTSR    Time  4    Period  Weeks    Status  New    Target Date  07/16/19        PT Long Term Goals - 06/18/19 1235      PT LONG TERM GOAL #1   Title  Pt will be independent with advanced HEP    Baseline  no knowledge    Time  12    Period   Weeks    Status  New    Target Date  09/10/19      PT LONG TERM GOAL #2   Title  left shoulder AROM scaption will improve to 0-115 degrees for improved overhead reaching.    Baseline  flex PROM eval 75    Time  12    Period  Weeks    Status  New    Target Date  09/10/19      PT LONG TERM GOAL #3   Title  FOTO will improve from 47% limitation   to 32 % limitation     indicating improved functional mobility.    Baseline  eval limiation 47%    Time  12    Period  Weeks    Status  New    Target Date  09/10/19      PT LONG TERM GOAL #4   Title  Tolerate light resistance exercises in flexion and scaption with minimal pain    Time  12    Period  Weeks    Status  New    Target Date  09/10/19      PT LONG TERM GOAL #5   Title  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 He is progressing with PT but still limited with ROM.    Personal Factors and Comorbidities  Age;Comorbidity 1;Comorbidity 2;Fitness    Comorbidities  Asthma, COPD, testicular CA, arthitis    Examination-Activity Limitations  Carry;Dressing;Hygiene/Grooming;Self Feeding    Examination-Participation Restrictions  Cleaning;Laundry    Stability/Clinical Decision Making  Stable/Uncomplicated    Rehab Potential  Good    PT Frequency  2x / week    PT Duration  12 weeks    PT Treatment/Interventions   ADLs/Self Care Home Management;Cryotherapy;Electrical Stimulation;Iontophoresis 4mg /ml Dexamethasone;Moist Heat;Ultrasound;Neuromuscular re-education;Therapeutic exercise;Therapeutic activities;Patient/family education;Manual techniques;Taping;Passive range of motion    PT Next Visit Plan  PROM, to 120 degrees, deltoid sub maximal isometrics, gentle resisted exerciase of elbow , wrist and hand    PT Home Exercise Plan  pendulum, elbow extension, seated shoulder flex on table with towel sub maximal isometrics of shld, with towel into wall, flex/ext, ER and ADD, tableslide PROM for flex, abd, ER    Consulted and Agree with Plan of Care  Patient       Patient will benefit from skilled therapeutic intervention in order to improve the following deficits and impairments:  Postural dysfunction, Pain, Impaired UE functional use, Decreased activity tolerance, Decreased range of motion, Decreased strength  Visit Diagnosis: Stiffness of left shoulder, not elsewhere classified  Left shoulder pain, unspecified chronicity  Muscle weakness (generalized)     Problem List Patient Active Problem List   Diagnosis Date Noted  . 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 Supratherapeutic INR 04/10/2019  . Enlarged prostate with urinary retention 09/08/2014  . Small bowel obstruction due to adhesions [facility]) 09/08/2014    Silvestre Mesi 06/28/2019, 11:49 AM  [place], [place], [address] Phone: [phone]   Fax:  [phone]  Name: DAMIONE POSTEN MRN: [id number] Date of Birth: 1941/05/12

## 2019-07-02 ENCOUNTER — Ambulatory Visit: Payer: Medicare HMO | Admitting: Physical Therapy

## 2019-07-02 ENCOUNTER — Other Ambulatory Visit: Payer: Self-pay

## 2019-07-02 ENCOUNTER — Encounter: Payer: Self-pay | Admitting: Physical Therapy

## 2019-07-02 DIAGNOSIS — M25612 Stiffness of left shoulder, not elsewhere classified: Secondary | ICD-10-CM | POA: Diagnosis not present

## 2019-07-02 DIAGNOSIS — M6281 Muscle weakness (generalized): Secondary | ICD-10-CM

## 2019-07-02 DIAGNOSIS — M25512 Pain in left shoulder: Secondary | ICD-10-CM

## 2019-07-02 NOTE — Therapy (Signed)
[place], [place], [address] Phone: [phone]   Fax:  [phone]  Physical Therapy Treatment  Patient Details  Name: Tony Hinton MRN: [id number] Date of Birth: 04/28/1941 Referring Provider (PT): Hinton, Tony Cork MD Tony Corns PA   Encounter Date: 07/02/2019  PT End of Session - 07/02/19 1106    Visit Number  6    Number of Visits  26    Date for PT Re-Evaluation  09/10/19    Authorization Type  Aetna Medicare  KX modifier after 15  visits and progress note after 10th visit    Authorization - Visit Number  6    Authorization - Number of Visits  26    PT Start Time  1101    PT Stop Time  1144    PT Time Calculation (min)  43 min    Activity Tolerance  Patient tolerated treatment well    Behavior During Therapy  [facility] for tasks assessed/performed       Past Medical History:  Diagnosis Date  . Anemia   . Arthritis   . Asthma    well controlled  . 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 COPD (chronic obstructive pulmonary disease) ([place])   . Dyspnea    chronic  . Factor 5 Leiden mutation, heterozygous ([place])   . Hypothyroidism   . Thyroid disease     Past Surgical History:  Procedure Laterality Date  . APPENDECTOMY    . COLON SURGERY  09/01/2014   sbo  . COLONOSCOPY  2015  . EYE SURGERY Bilateral    corneal transplants bil and cataracts bil  . KNEE SURGERY Right   . REVERSE SHOULDER ARTHROPLASTY Left 05/30/2019   Procedure: REVERSE SHOULDER ARTHROPLASTY;  Surgeon: Corky Mull, MD;  Location: [facility] ORS;  Service: Orthopedics;  Laterality: Left;  . SHOULDER SURGERY Left     There were no vitals filed for this visit.  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  No/denies         [facility] PT Assessment - 07/02/19 0001      PROM   Left Shoulder Flexion  110 Degrees    Left Shoulder ABduction  90 Degrees    Left Shoulder Internal Rotation  60 Degrees    Left Shoulder External Rotation  20 Degrees                   [facility] Adult PT Treatment/Exercise - 07/02/19 1107      Shoulder Exercises: Supine   Other Supine Exercises  PROM to left shld flex, abd, ER to 20 degrees,       Shoulder Exercises: Seated   Elevation Limitations  scapula elevation and depression    Other Seated Exercises  wrist ext/flex with 3lb 25 each, shoulder flexion using table to slide with towel 2 x 10 with stretch at end, elbow supported on table using 4 lb supination and pronation    Other Seated Exercises  isometrics EXT, flex, ER, abd and ADD sub maximal 10 reps  X6 sec each  all standing with pushing in to towel         Manual Therapy   Manual Therapy  Passive ROM    Passive ROM  PROM flexon to 100degrees ER to 15 degrees  gentle abduction to 70 degrees             PT Education - 07/02/19 1121    Education Details  added posture and deep breathing and shoulder flex slide on table with towel for end range stretch    Person(s) Educated  Patient    Methods  Explanation;Demonstration;Handout;Verbal cues;Tactile cues    Comprehension  Verbalized understanding;Returned demonstration       PT Short Term Goals - 07/02/19 1315      PT SHORT TERM GOAL #1   Title  Pt will be independent with initial HEP    Baseline  basic PROM and resisted elbow and wrist    Time  4    Period  Weeks    Status  Achieved    Target Date  07/16/19      PT SHORT TERM GOAL #2   Title  Pt will be able to achieve 90 degrees flex/abd with AAROM    Baseline  PROM abd 90/ flex 110 after manual    Time  4    Period  Weeks    Status  On-going        PT Long Term Goals - 06/18/19 1235       PT LONG TERM GOAL #1   Title  Pt will be independent with advanced HEP    Baseline  no knowledge    Time  12    Period  Weeks    Status  New    Target Date  09/10/19      PT LONG TERM GOAL #2   Title  left shoulder AROM scaption will improve to 0-115 degrees for improved overhead reaching.    Baseline  flex PROM eval 75    Time  12    Period  Weeks    Status  New    Target Date  09/10/19      PT LONG TERM GOAL #3   Title  FOTO will improve from 47% limitation   to 32 % limitation     indicating improved functional mobility.    Baseline  eval limiation 47%    Time  12    Period  Weeks    Status  New    Target Date  09/10/19      PT LONG TERM GOAL #4   Title  Tolerate light resistance exercises in flexion and scaption with minimal pain    Time  12    Period  Weeks    Status  New    Target Date  09/10/19      PT LONG TERM GOAL #5   Title  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  Pt continuing PROM by PT 2 x a week and isometrics at home.  Pt PROM shoulder flex 110 and 90today after manual.  Pt remains with limited ROM. Pt is independent with intial HEP for PROM of should ( isometrics) and gentle resisited wrist and elbow    Personal Factors and Comorbidities  Age;Comorbidity 1;Comorbidity 2;Fitness    Comorbidities  Asthma, COPD, testicular CA, arthitis    Examination-Activity  Limitations  Carry;Dressing;Hygiene/Grooming;Self Feeding    Stability/Clinical Decision Making  Stable/Uncomplicated    Clinical Decision Making  Low    Rehab Potential  Good    PT Frequency  2x / week    PT Duration  12 weeks    PT Treatment/Interventions  ADLs/Self Care Home Management;Cryotherapy;Electrical Stimulation;Iontophoresis 4mg /ml Dexamethasone;Moist Heat;Ultrasound;Neuromuscular re-education;Therapeutic exercise;Therapeutic activities;Patient/family education;Manual techniques;Taping;Passive range of motion    PT Next Visit Plan  PROM, to 120 degrees, deltoid sub maximal isometrics at various angles, , gentle resisted exerciase of elbow , wrist and hand used 3 lb with wrist    PT Home Exercise Plan  pendulum, elbow extension, seated shoulder flex on table with towel sub maximal isometrics of shld, with towel into wall, flex/ext, ER and ADD, tableslide PROM for flex, abd, ER    Consulted and Agree with Plan of Care  Patient       Patient will benefit from skilled therapeutic intervention in order to improve the following deficits and impairments:  Postural dysfunction, Pain, Impaired UE functional use, Decreased activity tolerance, Decreased range of motion, Decreased strength  Visit Diagnosis: Stiffness of left shoulder, not elsewhere classified  Left shoulder pain, unspecified chronicity  Muscle weakness (generalized)     Problem List Patient Active Problem List   Diagnosis Date Noted  . 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 Supratherapeutic INR 04/10/2019  . Enlarged prostate with urinary retention 09/08/2014  . Small bowel obstruction due to adhesions [facility]) 09/08/2014    Voncille Lo, PT Certified Exercise Expert for the Aging Adult  07/02/19 1:17 PM Phone: [phone] Fax: [facility] [address] [place], [place], [address] Phone: [phone]   Fax:   [phone]  Name: Tony Hinton MRN: [id number] Date of Birth: 1940-12-01

## 2019-07-02 NOTE — Patient Instructions (Addendum)
Posture Tips DO: - stand tall and erect - keep chin tucked in - keep head and shoulders in alignment - check posture regularly in mirror or large window - pull head back against headrest in car seat;  Change your position often.  Sit with lumbar support. DON'T: - slouch or slump while watching TV or reading - sit, stand or lie in one position  for too long;  Sitting is especially hard on the spine so if you sit at a desk/use the computer, then stand up often!   Copyright  VHI. All rights reserved.  Posture - Standing   Good posture is important. Avoid slouching and forward head thrust. Maintain curve in low back and align ears over shoul- ders, hips over ankles.  Pull your belly button in toward your back bone. Ribs lifted up and a golden thread to the sky pulling up your sternum. Chin down.  TAke deep breath   Copyright  VHI. All rights reserved.  Posture - Sitting   Sit upright, head facing forward. Try using a roll to support lower back. Keep shoulders relaxed, and avoid rounded back. Keep hips level with knees. Avoid crossing legs for long periods. Sit on sit bones not your tail bone      .SHOULDER: Flexion On Table  Place hands on table, elbows straight. Move hips away from body. Press hands down into table. Hold 10_ seconds. __ reps per set, __3_ sets per day.     Voncille Lo, PT Certified Exercise Expert for the Aging Adult  07/02/19 11:20 AM Phone: [phone] Fax: [phone]

## 2019-07-04 ENCOUNTER — Ambulatory Visit: Payer: Medicare HMO | Admitting: Physical Therapy

## 2019-07-04 ENCOUNTER — Other Ambulatory Visit: Payer: Self-pay

## 2019-07-04 DIAGNOSIS — M6281 Muscle weakness (generalized): Secondary | ICD-10-CM

## 2019-07-04 DIAGNOSIS — M25612 Stiffness of left shoulder, not elsewhere classified: Secondary | ICD-10-CM | POA: Diagnosis not present

## 2019-07-04 DIAGNOSIS — M25512 Pain in left shoulder: Secondary | ICD-10-CM

## 2019-07-04 NOTE — Therapy (Signed)
[place], [place], [address] Phone: [phone]   Fax:  [phone]  Physical Therapy Treatment  Patient Details  Name: Tony Hinton MRN: [id number] Date of Birth: Jun 09, 1941 Referring Provider (PT): Poggi, Marshall Cork MD Lattie Corns PA   Encounter Date: 07/04/2019  PT End of Session - 07/04/19 1233    Visit Number  7    Number of Visits  26    Date for PT Re-Evaluation  09/10/19    Authorization Type  Aetna Medicare  KX modifier after 15  visits and progress note after 10th visit    Authorization - Visit Number  7    Authorization - Number of Visits  26    PT Start Time  1045    PT Stop Time  1130    PT Time Calculation (min)  45 min    Activity Tolerance  Patient tolerated treatment well    Behavior During Therapy  [facility] for tasks assessed/performed       Past Medical History:  Diagnosis Date  . Anemia   . Arthritis   . Asthma    well controlled  . 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 COPD (chronic obstructive pulmonary disease) ([place])   . Dyspnea    chronic  . Factor 5 Leiden mutation, heterozygous ([place])   . Hypothyroidism   . Thyroid disease     Past Surgical History:  Procedure Laterality Date  . APPENDECTOMY    . COLON SURGERY  09/01/2014   sbo  . COLONOSCOPY  2015  . EYE SURGERY Bilateral    corneal transplants bil and cataracts bil  . KNEE SURGERY Right   . REVERSE SHOULDER ARTHROPLASTY Left 05/30/2019   Procedure: REVERSE SHOULDER ARTHROPLASTY;  Surgeon: Corky Mull, MD;  Location: [facility] ORS;  Service: Orthopedics;  Laterality: Left;  . SHOULDER SURGERY Left     There were no vitals filed for this visit.  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  No/denies    Pain Onset  1 to 4 weeks ago                       [facility] Adult PT Treatment/Exercise - 07/04/19 0001      Shoulder Exercises: Seated   Elevation Limitations  scapula elevation and depression    Other Seated Exercises  wrist ext/flex with 3lb 25 each, shoulder flexion, abd, and ER stretching using table to slide with towel 2 x 10 with stretch at end, elbow supported on table using 4 lb supination and pronation, tricep ext with band around neck and cues to only hinge at the elbow and not move his shoullder 2X10 reps with red    Other Seated Exercises  isometrics EXT, flex, ER, abd and ADD sub maximal 12 reps  X6 sec each all standing with pushing in to towel         Manual Therapy   Manual Therapy  Passive ROM    Passive ROM  PROM all planes  PT Short Term Goals - 07/02/19 1315      PT SHORT TERM GOAL #1   Title  Pt will be independent with initial HEP    Baseline  basic PROM and resisted elbow and wrist    Time  4    Period  Weeks    Status  Achieved    Target Date  07/16/19      PT SHORT TERM GOAL #2   Title  Pt will be able to achieve 90 degrees flex/abd with AAROM    Baseline  PROM abd 90/ flex 110 after manual    Time  4    Period  Weeks    Status  On-going        PT Long Term Goals - 06/18/19 1235      PT LONG TERM GOAL #1   Title  Pt will be independent with advanced HEP    Baseline  no knowledge    Time  12    Period  Weeks    Status  New    Target Date  09/10/19      PT LONG TERM GOAL #2   Title  left shoulder AROM scaption will improve to 0-115 degrees for improved overhead reaching.    Baseline  flex PROM eval 75    Time  12    Period  Weeks    Status  New    Target Date  09/10/19      PT LONG TERM GOAL #3   Title  FOTO will improve from 47% limitation   to 32 % limitation     indicating improved functional mobility.     Baseline  eval limiation 47%    Time  12    Period  Weeks    Status  New    Target Date  09/10/19      PT LONG TERM GOAL #4   Title  Tolerate light resistance exercises in flexion and scaption with minimal pain    Time  12    Period  Weeks    Status  New    Target Date  09/10/19      PT LONG TERM GOAL #5   Title  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 Per protocol can progress from PROM on 07/11/19    Personal Factors and Comorbidities  Age;Comorbidity 1;Comorbidity 2;Fitness    Comorbidities  Asthma, COPD, testicular CA, arthitis    Examination-Activity Limitations  Carry;Dressing;Hygiene/Grooming;Self Feeding    Stability/Clinical Decision Making  Stable/Uncomplicated    Rehab Potential  Good  PT Frequency  2x / week    PT Duration  12 weeks    PT Treatment/Interventions  ADLs/Self Care Home Management;Cryotherapy;Electrical Stimulation;Iontophoresis 4mg /ml Dexamethasone;Moist Heat;Ultrasound;Neuromuscular re-education;Therapeutic exercise;Therapeutic activities;Patient/family education;Manual techniques;Taping;Passive range of motion    PT Next Visit Plan  PROM, to 120 degrees, deltoid sub maximal isometrics at various angles, , gentle resisted exerciase of elbow , wrist and hand used 3 lb with wrist    PT Home Exercise Plan  pendulum, elbow extension,  seated shoulder flex on table with towel sub maximal isometrics of shld, with towel into wall, flex/ext, ER and ADD, tableslide PROM for flex, abd, ER    Consulted and Agree with Plan of Care  Patient       Patient will benefit from skilled therapeutic intervention in order to improve the following deficits and impairments:  Postural dysfunction, Pain, Impaired UE functional use, Decreased activity tolerance, Decreased range of motion, Decreased strength  Visit Diagnosis: Stiffness of left shoulder, not elsewhere classified  Left shoulder pain, unspecified chronicity  Muscle weakness (generalized)     Problem List Patient Active Problem List   Diagnosis Date Noted  . 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 Supratherapeutic INR 04/10/2019  . Enlarged prostate with urinary retention 09/08/2014  . Small bowel obstruction due to adhesions [facility]) 09/08/2014    Silvestre Mesi 07/04/2019, 12:37 PM  [facility] [address] [place], [place], [address] Phone: [phone]   Fax:  [phone]  Name: Tony Hinton MRN: [id number] Date of Birth: April 29, 1941

## 2019-07-09 ENCOUNTER — Ambulatory Visit: Payer: Medicare HMO | Admitting: Physical Therapy

## 2019-07-09 ENCOUNTER — Encounter: Payer: Self-pay | Admitting: Physical Therapy

## 2019-07-09 ENCOUNTER — Other Ambulatory Visit: Payer: Self-pay

## 2019-07-09 DIAGNOSIS — M6281 Muscle weakness (generalized): Secondary | ICD-10-CM

## 2019-07-09 DIAGNOSIS — M25612 Stiffness of left shoulder, not elsewhere classified: Secondary | ICD-10-CM | POA: Diagnosis not present

## 2019-07-09 DIAGNOSIS — M25512 Pain in left shoulder: Secondary | ICD-10-CM

## 2019-07-09 NOTE — Therapy (Signed)
[place], [place], [address] Phone: [phone]   Fax:  [phone]  Physical Therapy Treatment  Patient Details  Name: Tony Hinton MRN: [id number] Date of Birth: 1940/11/13 Referring Provider (PT): Hinton, Tony Cork MD Tony Corns PA   Encounter Date: 07/09/2019  PT End of Session - 07/09/19 1334    Visit Number  8    Number of Visits  26    Date for PT Re-Evaluation  09/10/19    Authorization Type  [employer] Medicare  KX modifier after 15  visits and progress note after 10th visit    Authorization - Visit Number  8    Authorization - Number of Visits  26    PT Start Time  1102    PT Stop Time  1145    PT Time Calculation (min)  43 min    Activity Tolerance  Patient tolerated treatment well    Behavior During Therapy  [facility] for tasks assessed/performed       Past Medical History:  Diagnosis Date  . Anemia   . Arthritis   . Asthma    well controlled  . 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 COPD (chronic obstructive pulmonary disease) ([place])   . Dyspnea    chronic  . Factor 5 Leiden mutation, heterozygous ([place])   . Hypothyroidism   . Thyroid disease     Past Surgical History:  Procedure Laterality Date  . APPENDECTOMY    . COLON SURGERY  09/01/2014   sbo  . COLONOSCOPY  2015  . EYE SURGERY Bilateral    corneal transplants bil and cataracts bil  . KNEE SURGERY Right   . REVERSE SHOULDER ARTHROPLASTY Left 05/30/2019   Procedure: REVERSE SHOULDER ARTHROPLASTY;  Surgeon: Tony Mull, MD;  Location: [facility] ORS;  Service: Orthopedics;  Laterality: Left;  . SHOULDER SURGERY Left     There were no vitals filed for this visit.  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  No/denies         [facility] PT Assessment - 07/09/19 0001      PROM   Left Shoulder Flexion  121 Degrees    Left Shoulder ABduction  92 Degrees    Left Shoulder Internal Rotation  60 Degrees    Left Shoulder External Rotation  20 Degrees                   [facility] Adult PT Treatment/Exercise - 07/09/19 0001      Shoulder Exercises: Supine   Other Supine Exercises  PROM to left shld flex, abd, ER to 20 degrees,     Other Supine Exercises  AAROM supine cane for flex and  horizontal abduction 2 x 10 each      Shoulder Exercises: Seated   Elevation Limitations  scapula elevation and depression    Other Seated Exercises  isometrics EXT, flex, ER, abd and ADD sub maximal 12 reps  X6 sec each all standing with pushing in to towel         Shoulder  Exercises: Standing   Other Standing Exercises  standing flex/abd isometric with long lever arm. Tony Hinton straight 2 x 10 with various angles of contact to wall    Other Standing Exercises  shoulder scapular squeezes 3 x 10      Manual Therapy   Manual Therapy  Passive ROM    Manual therapy comments  isometric hold at various angles  of flexion and abduciton. iniitiation of strengthening    Passive ROM  PROM all planes               PT Short Term Goals - 07/02/19 1315      PT SHORT TERM GOAL #1   Title  Pt will be independent with initial HEP    Baseline  basic PROM and resisted elbow and wrist    Time  4    Period  Weeks    Status  Achieved    Target Date  07/16/19      PT SHORT TERM GOAL #2   Title  Pt will be able to achieve 90 degrees flex/abd with AAROM    Baseline  PROM abd 90/ flex 110 after manual    Time  4    Period  Weeks    Status  On-going        PT Long Term Goals - 06/18/19 1235      PT LONG TERM GOAL #1   Title  Pt will be independent with advanced HEP    Baseline  no knowledge    Time  12    Period  Weeks     Status  New    Target Date  09/10/19      PT LONG TERM GOAL #2   Title  left shoulder AROM scaption will improve to 0-115 degrees for improved overhead reaching.    Baseline  flex PROM eval 75    Time  12    Period  Weeks    Status  New    Target Date  09/10/19      PT LONG TERM GOAL #3   Title  FOTO will improve from 47% limitation   to 32 % limitation     indicating improved functional mobility.    Baseline  eval limiation 47%    Time  12    Period  Weeks    Status  New    Target Date  09/10/19      PT LONG TERM GOAL #4   Title  Tolerate light resistance exercises in flexion and scaption with minimal pain    Time  12    Period  Weeks    Status  New    Target Date  09/10/19      PT LONG TERM GOAL #5   Title  Mr. 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  Pt also progressed to long lever arm isometrics of shoulder flex/abd.    Personal Factors and Comorbidities  Age;Comorbidity 1;Comorbidity 2;Fitness    Comorbidities  Asthma, COPD, testicular CA, arthitis    Examination-Activity Limitations  Carry;Dressing;Hygiene/Grooming;Self Feeding    Stability/Clinical Decision Making  Stable/Uncomplicated    Clinical Decision Making  Low    Rehab Potential  Good    PT Frequency  2x / week    PT Duration  12 weeks    PT  Treatment/Interventions  ADLs/Self Care Home Management;Cryotherapy;Electrical Stimulation;Iontophoresis 4mg /ml Dexamethasone;Moist Heat;Ultrasound;Neuromuscular re-education;Therapeutic exercise;Therapeutic activities;Patient/family education;Manual techniques;Taping;Passive range of motion    PT Next Visit Plan  Move on to more active movement/ cane exericese  wall stretching    PT Home Exercise Plan  pendulum, elbow extension, seated shoulder flex on table with towel sub maximal isometrics of shld, with towel into wall, flex/ext, ER and ADD, tableslide PROM for flex, abd, ER    Consulted and Agree with Plan of Care  Patient       Patient will benefit from skilled therapeutic intervention in order to improve the following deficits and impairments:  Postural dysfunction, Pain, Impaired UE functional use, Decreased activity tolerance, Decreased range of motion, Decreased strength  Visit Diagnosis: Stiffness of left shoulder, not elsewhere classified  Left shoulder pain, unspecified chronicity  Muscle weakness (generalized)     Problem List Patient Active Problem List   Diagnosis Date Noted  . 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 Supratherapeutic INR 04/10/2019  . Enlarged prostate with urinary retention 09/08/2014  . Small bowel obstruction due to adhesions [facility]) 09/08/2014   Voncille Lo, PT Certified Exercise Expert for the Aging Adult  07/09/19 1:34 PM Phone: [phone] Fax: [facility] [address] [place], [place], [address] Phone: [phone]   Fax:  [phone]  Name: Tony Hinton MRN: [id number] Date of Birth: [birth year]

## 2019-07-09 NOTE — Patient Instructions (Addendum)
SHOULDER: External Rotation - Supine (Cane)   Copyright  VHI. All rights reserved.  Cane Horizontal - Supine   With straight arms holding cane above shoulders, bring cane out to right, center, out to left, and back to above head. Repeat 10 x 2___ times. Do __2_ times per day.  Copyright  VHI. All rights reserved.  Cane Exercise: Flexion   Lie on back, holding cane above chest. Keeping arms as straight as possible, lower cane toward floor beyond head. Hold _5___ seconds. Repeat __10 x 2__ times. Do __2__ sessions per day.  http://[email or web address].exer.us/91   Copyright  VHI. All rights reserved.       Voncille Lo, PT Certified Exercise Expert for the Aging Adult  07/09/19 11:45 AM Phone: [phone] Fax: [phone]

## 2019-07-12 ENCOUNTER — Ambulatory Visit: Payer: Medicare HMO | Admitting: Physical Therapy

## 2019-07-12 ENCOUNTER — Encounter: Payer: Self-pay | Admitting: Physical Therapy

## 2019-07-12 ENCOUNTER — Other Ambulatory Visit: Payer: Self-pay

## 2019-07-12 DIAGNOSIS — M25512 Pain in left shoulder: Secondary | ICD-10-CM

## 2019-07-12 DIAGNOSIS — M25612 Stiffness of left shoulder, not elsewhere classified: Secondary | ICD-10-CM | POA: Diagnosis not present

## 2019-07-12 DIAGNOSIS — M6281 Muscle weakness (generalized): Secondary | ICD-10-CM

## 2019-07-12 NOTE — Therapy (Signed)
[place], [place], [address] Phone: [phone]   Fax:  [phone]  Physical Therapy Treatment/Progress Note  Patient Details  Name: Tony Hinton MRN: [id number] Date of Birth: 26-Nov-1940 Referring Provider (PT): Poggi, Marshall Cork MD Lattie Corns PA   Encounter Date: 07/12/2019  PT End of Session - 07/12/19 1100    Visit Number  9    Number of Visits  26    Date for PT Re-Evaluation  09/10/19    Authorization Type  [employer]na Medicare  KX modifier after 15  visits and progress note after 19th visit    Authorization - Visit Number  9    Authorization - Number of Visits  26    PT Start Time  1100    PT Stop Time  1150    PT Time Calculation (min)  50 min    Activity Tolerance  Patient tolerated treatment well    Behavior During Therapy  [facility] for tasks assessed/performed       Past Medical History:  Diagnosis Date  . Anemia   . Arthritis   . Asthma    well controlled  . 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 COPD (chronic obstructive pulmonary disease) ([place])   . Dyspnea    chronic  . Factor 5 Leiden mutation, heterozygous ([place])   . Hypothyroidism   . Thyroid disease     Past Surgical History:  Procedure Laterality Date  . APPENDECTOMY    . COLON SURGERY  09/01/2014   sbo  . COLONOSCOPY  2015  . EYE SURGERY Bilateral    corneal transplants bil and cataracts bil  . KNEE SURGERY Right   . REVERSE SHOULDER ARTHROPLASTY Left 05/30/2019   Procedure: REVERSE SHOULDER ARTHROPLASTY;  Surgeon: Corky Mull, MD;  Location: [facility] ORS;  Service: Orthopedics;  Laterality: Left;  . SHOULDER SURGERY Left     There were no vitals filed for this visit.  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  No/denies    Pain Score  0-No pain    Pain Location  Shoulder    Pain Orientation  Left         [facility] PT Assessment - 07/12/19 0001      Assessment   Medical Diagnosis  Reverse Total Shoulder Left    Referring Provider (PT)  Poggi, Marshall Cork MD Lattie Corns PA    Onset Date/Surgical Date  05/30/19    Hand Dominance  Left      AROM   Overall AROM   Deficits    Overall AROM Comments  No AROM of LT at this time.  PROM 1st 6 weeks,     Right Shoulder Flexion  140 Degrees    Right Shoulder ABduction  120 Degrees    Right Shoulder Internal Rotation  40 Degrees    Right Shoulder External Rotation  80 Degrees    Left Shoulder Flexion  60 Degrees    Left Shoulder ABduction  60 Degrees  Left Elbow Flexion  145    Left Elbow Extension  -10   PROM -3     PROM   Left Shoulder Flexion  121 Degrees    Left Shoulder ABduction  92 Degrees    Left Shoulder Internal Rotation  60 Degrees    Left Shoulder External Rotation  31 Degrees      Strength   Overall Strength  Deficits    Left Shoulder Flexion  3-/5    Left Shoulder Extension  3-/5    Left Shoulder ABduction  3-/5                   [facility] Adult PT Treatment/Exercise - 07/12/19 0001      Shoulder Exercises: Supine   ABduction Limitations  elbow 5 lb flex/ ext x 10 in supine    Diagonals Limitations  use of UE RANGER for PROM diagonals    Other Supine Exercises  AAROM supine cane for flex and  horizontal abduction 2 x 10 each      Shoulder Exercises: Seated   Elevation Limitations  scapular squeezes x 20    Other Seated Exercises  wrist ext/flex with 3lb 25 each, shoulder flexion, abd, and ER stretching using table to slide with towel 2 x 10 with stretch at end, elbow supported on table using 4 lb supination and pronation, tricep ext with band  around neck and cues to only hinge at the elbow and not move his shoullder 2X10 reps with red    Other Seated Exercises  isometrics EXT, flex, ER, abd and ADD sub maximal 12 reps  X6 sec each all standing with pushing in to towel         Shoulder Exercises: Standing   Other Standing Exercises  standing flex/abd isometric with long lever arm. Alden Hipp straight 2 x 10 with various angles of contact to wall    Other Standing Exercises  shoulder scapular squeezes 3 x 10, wall ladder with LT to level 22 x 8      Manual Therapy   Manual Therapy  Passive ROM    Manual therapy comments  isometric hold at various angles  of flexion and abduciton. iniitiation of strengthening     Passive ROM  PROM all planes               PT Short Term Goals - 07/12/19 1210      PT SHORT TERM GOAL #1   Title  Pt will be independent with initial HEP    Baseline  basic PROM and resisted elbow and wrist beginning AAROM    Time  4    Period  Weeks    Status  Achieved    Target Date  07/16/19      PT SHORT TERM GOAL #2   Title  Pt will be able to achieve 90 degrees flex/abd with AAROM    Baseline  PROM flex 121/abd 92, IR 60, ER 31    Time  4    Period  Weeks    Status  Achieved    Target Date  07/16/19      PT SHORT TERM GOAL #3   Title  Pt will be able to verbalize precautians for rTSR    Baseline  Pt able to verbalize precautians and need for good posture when exericising to protect joint    Time  4    Period  Weeks    Status  Achieved    Target Date  07/16/19        PT Long Term Goals - 07/12/19 1212      PT LONG TERM GOAL #1   Title  Pt will be independent with advanced HEP    Baseline  Pt is now weaned from sling and ready to progress as MD deems ready    Time  12    Period  Weeks    Status  On-going    Target Date  09/10/19      PT LONG TERM GOAL #2   Title  left shoulder AROM scaption will improve to 0-115 degrees for improved overhead reaching.    Baseline  PROM only at this  time with flex 121/ abd 90    Time  12    Period  Weeks    Status  On-going    Target Date  09/10/19      PT LONG TERM GOAL #3   Title  FOTO will improve from 47% limitation   to 32 % limitation     indicating improved functional mobility.    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  Pt has been compliant with PROM of L shoulder and now beginning AAROM of LT shoulder with AROM /light resistance of LT elbow.  Pt PROM LT shoulder flex 121, abd 92, IR 60, ER 31 and no pain with LT shouder.  He is tight in abduction,  He is able to participate with isometric strengthening sub maximal.  He has been weaned of his sling and I feel is elbow extension defict will improve without sling wear. All 3 STGs achieved. Pt tried to actively raise arms today at end of session. AROM flex 60, abd 60.  Pt clearly has weakness but hope to begin AROM/resistance as pt can tolerate and as MD approves after office visit today.  Contineu  POC for additional 6 weeks    Personal Factors and Comorbidities  Age;Comorbidity 1;Comorbidity 2;Fitness    Comorbidities  Asthma, COPD, testicular CA, arthitis    Examination-Activity Limitations  Carry;Dressing;Hygiene/Grooming;Self Feeding    Stability/Clinical Decision Making  Stable/Uncomplicated    Clinical Decision Making  Low    Rehab Potential  Good    PT Frequency  2x / week    PT Duration  12 weeks    PT Treatment/Interventions  ADLs/Self Care Home Management;Cryotherapy;Electrical Stimulation;Iontophoresis 4mg /ml Dexamethasone;Moist Heat;Ultrasound;Neuromuscular re-education;Therapeutic exercise;Therapeutic activities;Patient/family education;Manual techniques;Taping;Passive range of motion    PT Next Visit Plan  Pt to see MD today to progress to AAROM/AROM as MD deems necessary. Do FOTO next visit    PT Home Exercise Plan  pendulum, elbow extension, seated shoulder flex on table with towel sub maximal isometrics of shld, with towel into wall, flex/ext, ER and ADD, tableslide PROM for flex, abd, ER AAROM cane flex/abd    Consulted and Agree with Plan of Care  Patient       Patient will benefit from skilled therapeutic intervention in order to improve the following deficits and impairments:  Postural dysfunction, Pain, Impaired UE functional use, Decreased activity tolerance, Decreased range of motion, Decreased strength  Visit Diagnosis: Stiffness of left shoulder, not elsewhere classified  Left shoulder pain, unspecified chronicity  Muscle weakness (generalized)     Problem List Patient Active Problem List   Diagnosis Date Noted  . 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 Supratherapeutic INR 04/10/2019  . Enlarged prostate with urinary retention 09/08/2014  . Small bowel obstruction due to adhesions [facility]) 09/08/2014    Voncille Lo, PT Certified Exercise Expert for the Aging Adult  07/12/19 12:38 PM Phone: [phone] Fax:  [facility] [address] [place], [place], [address] Phone: [phone]   Fax:  [phone]  Name: Tony Hinton MRN: [id number] Date of Birth: 1941-04-03   Progress Note Reporting Period 06-19-19 to 07-12-19  See note Above  for Objective Data and Assessment of Progress/Goals.

## 2019-07-16 ENCOUNTER — Ambulatory Visit: Payer: Medicare HMO | Admitting: Physical Therapy

## 2019-07-16 ENCOUNTER — Other Ambulatory Visit: Payer: Self-pay

## 2019-07-16 ENCOUNTER — Encounter: Payer: Self-pay | Admitting: Physical Therapy

## 2019-07-16 DIAGNOSIS — M25612 Stiffness of left shoulder, not elsewhere classified: Secondary | ICD-10-CM

## 2019-07-16 DIAGNOSIS — M6281 Muscle weakness (generalized): Secondary | ICD-10-CM

## 2019-07-16 DIAGNOSIS — M25512 Pain in left shoulder: Secondary | ICD-10-CM

## 2019-07-16 NOTE — Therapy (Addendum)
[place], [place], [address] Phone: [phone]   Fax:  [phone]  Physical Therapy Treatment  Patient Details  Name: Tony Hinton MRN: [id number] Date of Birth: 07-06-1941 Referring Provider (PT): Poggi, Marshall Cork MD Lattie Corns PA   Encounter Date: 07/16/2019  PT End of Session - 07/16/19 1054    Visit Number  10    Number of Visits  26    Date for PT Re-Evaluation  09/10/19    Authorization Type  [employer]na Medicare  KX modifier after 15  visits and progress note after 19th visit    Authorization - Visit Number  10    Authorization - Number of Visits  26    PT Start Time  1100    PT Stop Time  1156    PT Time Calculation (min)  56 min    Activity Tolerance  Patient tolerated treatment well    Behavior During Therapy  [facility] for tasks assessed/performed       Past Medical History:  Diagnosis Date  . Anemia   . Arthritis   . Asthma    well controlled  . 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 COPD (chronic obstructive pulmonary disease) ([place])   . Dyspnea    chronic  . Factor 5 Leiden mutation, heterozygous ([place])   . Hypothyroidism   . Thyroid disease     Past Surgical History:  Procedure Laterality Date  . APPENDECTOMY    . COLON SURGERY  09/01/2014   sbo  . COLONOSCOPY  2015  . EYE SURGERY Bilateral    corneal transplants bil and cataracts bil  . KNEE SURGERY Right   . REVERSE SHOULDER ARTHROPLASTY Left 05/30/2019   Procedure: REVERSE SHOULDER ARTHROPLASTY;  Surgeon: Corky Mull, MD;  Location: [facility] ORS;  Service: Orthopedics;  Laterality: Left;  . SHOULDER SURGERY Left     There were no vitals filed for this visit.  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  No/denies    Pain Score  0-No pain    Pain Location  Shoulder    Pain Orientation  Left    Pain Type  Surgical pain    Pain Onset  More than a month ago    Pain Frequency  Occasional    Aggravating Factors   moving arm above 90 degrees flex/abd         [facility] PT Assessment - 07/16/19 0001      AROM   Overall AROM Comments  supine with hands on back of head,  RT shld olecranon to mat,9 inches  LT before TPDN 15.5 in, after 13 inches                   [facility] Adult PT Treatment/Exercise - 07/16/19 0001      Self-Care   Self-Care  Other Self-Care Comments    Other Self-Care Comments   education on TPDN and aftercare and precautions      Knee/Hip Exercises: Standing   Other Standing Knee Exercises  using 10 lb KB and holding onto counter with Left hand 2 x 10 for ambulation strength.   AROM of Left arm     Shoulder Exercises: Supine   ABduction Limitations  elbow 5 lb flex/ ext x 10 in supine      Shoulder Exercises: Seated   Elevation Limitations  scapular squeezes x 20      Shoulder Exercises: Sidelying   External Rotation  Strengthening;Left;10 reps    External Rotation Limitations  x 2 with 1 lb wt and towel under arm, VC and TC    Other Sidelying Exercises  AAROM left shoulder flexion in rt sidelying with PT closely monitorring on table for ease of movement with VC and TC      Shoulder Exercises: Standing   Protraction Limitations  modified counter push up x 10    Other Standing Exercises  standing with yellow t band and flex and abd to tolerance and strength limitation 2 x 10,      Other Standing Exercises  wall ladder 5 x flex and 5 x abd left , pulleys for 3 minutes      Shoulder Exercises: Stretch   Other Shoulder Stretches  Supine hands behind head and touch elbow to the bed. hold 10 sec x 10    Other Shoulder Stretches   posterior capsule stretch with shoulder adduction 30 sec hold x 3 left only VC and TC for correct execution      Moist Heat Therapy   Number Minutes Moist Heat  12 Minutes    Moist Heat Location  Shoulder   left     Manual Therapy   Manual Therapy  Passive ROM    Manual therapy comments  skilled palpation with TPDN, isometric/ AAROM shoulder flexion in sidelying and supine with PT assist, use of yellow t band as able    Soft tissue mobilization  left lats,subscapular, teres minor/major, pec major and minor    Passive ROM  PROM all planes       Trigger Point Dry Needling - 07/16/19 0001    Consent Given?  Yes    Education Handout Provided  Yes    Muscles Treated Upper Quadrant  Subscapularis;Pectoralis major;Pectoralis minor;Teres major;Teres minor;Latissimus dorsi   all left only     Pt TPDN with twitch responses for all muscles and palpable lengthening except Teres minor and major     PT Education - 07/16/19 1153    Education Details  added strengthening  HEP with AAROM/stretching and sidelying AROM educaton on TPDN aftercare and precautions    Person(s) Educated  Patient    Methods  Explanation;Demonstration;Tactile cues;Verbal cues;Handout    Comprehension  Verbalized understanding;Returned demonstration       PT Short Term Goals - 07/12/19 1210      PT SHORT TERM GOAL #1   Title  Pt will be independent with initial HEP    Baseline  basic PROM and resisted elbow and wrist beginning AAROM    Time  4    Period  Weeks    Status  Achieved    Target Date  07/16/19      PT SHORT TERM GOAL #2   Title  Pt will be able to achieve 90 degrees flex/abd with AAROM    Baseline  PROM flex 121/abd 92, IR 60, ER 31    Time  4    Period  Weeks    Status  Achieved    Target Date  07/16/19  PT SHORT TERM GOAL #3   Title  Pt will be able to verbalize precautians for rTSR    Baseline  Pt able to verbalize precautians and need for good posture when exericising to protect joint     Time  4    Period  Weeks    Status  Achieved    Target Date  07/16/19        PT Long Term Goals - 07/12/19 1212      PT LONG TERM GOAL #1   Title  Pt will be independent with advanced HEP    Baseline  Pt is now weaned from sling and ready to progress as MD deems ready    Time  12    Period  Weeks    Status  On-going    Target Date  09/10/19      PT LONG TERM GOAL #2   Title  left shoulder AROM scaption will improve to 0-115 degrees for improved overhead reaching.    Baseline  PROM only at this time with flex 121/ abd 90    Time  12    Period  Weeks    Status  On-going    Target Date  09/10/19      PT LONG TERM GOAL #3   Title  FOTO will improve from 47% limitation   to 32 % limitation     indicating improved functional mobility.    Baseline  eval limiation 47%    Time  12    Period  Weeks    Status  On-going    Target Date  09/10/19      PT LONG TERM GOAL #4   Title  Tolerate light resistance exercises in flexion and scaption with minimal pain    Baseline  Only at [facility] at this time beginning 6 weeks post surgery    Time  12    Period  Weeks    Status  On-going    Target Date  09/10/19      PT LONG TERM GOAL #5   Title  Mr.Schultzwill be able to return to driving and manage steering wheel safely without exacerbating pain,    Baseline  unable to drive at this time due to precautians and no active motion of Left shoulder    Time  12    Period  Weeks    Status  On-going    Target Date  09/10/19      PT LONG TERM GOAL #6   Title  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  Pt reminded to utilize posture with mirror while doing exericses in order to decrease elevation of left trapezius.  Pt  consented to TPDN for tightenend lats. subscapularis, teres, pecs.  Pt with increased AROM and measured supine bil Shouder abd , ER  ,elbow olecranon to mat in supine RT 9in, LT before TPDN 15,5 in and after 13 in.  Pt reported more abilty to lift arm after TPDN.  Will continue to progress exercise. AAROM/AROM as protocol guides.  Will  continue POC    Personal Factors and Comorbidities  Age;Comorbidity 1;Comorbidity 2;Fitness    Comorbidities  Asthma, COPD, testicular CA, arthitis    Examination-Activity Limitations  Carry;Dressing;Hygiene/Grooming;Self Feeding    Examination-Participation Restrictions  Cleaning;Laundry    Stability/Clinical Decision Making  Stable/Uncomplicated    Clinical Decision Making  Low    Rehab Potential  Good    PT Frequency  2x / week    PT Duration  12 weeks    PT Treatment/Interventions  ADLs/Self Care Home Management;Cryotherapy;Electrical Stimulation;Iontophoresis 4mg /ml Dexamethasone;Moist Heat;Ultrasound;Neuromuscular re-education;Therapeutic exercise;Therapeutic activities;Patient/family education;Manual techniques;Taping;Passive range of motion    PT Next Visit Plan  Pt to see MD today to progress to AAROM/AROM as MD deems necessary. Do FOTO next visit    PT Home Exercise Plan  pendulum, elbow extension, seated shoulder flex on table with towel sub maximal isometrics of shld, with towel into wall, flex/ext, ER and ADD, tableslide PROM for flex, abd, ER AAROM cane flex/abd    Consulted and Agree with Plan of Care  Patient       Patient will benefit from skilled therapeutic intervention in order to improve the following deficits and impairments:  Postural dysfunction, Pain, Impaired UE functional use, Decreased activity tolerance, Decreased range of motion, Decreased strength  Visit Diagnosis: Stiffness of left shoulder, not elsewhere classified  Left shoulder pain, unspecified chronicity  Muscle weakness (generalized)     Problem List Patient Active  Problem List   Diagnosis Date Noted  . 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 Supratherapeutic INR 04/10/2019  . Enlarged prostate with urinary retention 09/08/2014  . Small bowel obstruction due to adhesions [facility]) 09/08/2014   Voncille Lo, PT Certified Exercise Expert for the Aging Adult  07/16/19 12:40 PM Phone: [phone] Fax: [facility] [address] [place], [place], [address] Phone: [phone]   Fax:  [phone]  Name: MYLO PETITE MRN: [id number] Date of Birth: September 02, 1941

## 2019-07-16 NOTE — Patient Instructions (Addendum)
      Trigger Point Dry Needling  . What is Trigger Point Dry Needling (DN)? o DN is a physical therapy technique used to treat muscle pain and dysfunction. Specifically, DN helps deactivate muscle trigger points (muscle knots).  o A thin filiform needle is used to penetrate the skin and stimulate the underlying trigger point. The goal is for a local twitch response (LTR) to occur and for the trigger point to relax. No medication of any kind is injected during the procedure.   . What Does Trigger Point Dry Needling Feel Like?  o The procedure feels different for each individual patient. Some patients report that they do not actually feel the needle enter the skin and overall the process is not painful. Very mild bleeding may occur. However, many patients feel a deep cramping in the muscle in which the needle was inserted. This is the local twitch response.   Marland Kitchen How Will I feel after the treatment? o Soreness is normal, and the onset of soreness may not occur for a few hours. Typically this soreness does not last longer than two days.  o Bruising is uncommon, however; ice can be used to decrease any possible bruising.  o In rare cases feeling tired or nauseous after the treatment is normal. In addition, your symptoms may get worse before they get better, this period will typically not last longer than 24 hours.   . What Can I do After My Treatment? o Increase your hydration by drinking more water for the next 24 hours. o You may place ice or heat on the areas treated that have become sore, however, do not use heat on inflamed or bruised areas. Heat often brings more relief post needling. o You can continue your regular activities, but vigorous activity is not recommended initially after the treatment for 24 hours. o DN is best combined with other physical therapy such as strengthening, stretching, and other therapies.       1)  Supine hands behind head and touch elbow to the bed.   Lying on  your back place your hands behind head and try to stretch shoulder  With elbow going toward bed, stretch and rest in this position during the day.  2/ Counter modified push ups with both hands.  2 x 10  3) sit to stand by sink for lower extremity strength and holding on with left Upper extremitiy for shoulder stretch  2 x 10.   A gallon of water of milk is 8 lb   4) You may use pulleys to have RT arm help LT arm stretch as shown in clinic     Voncille Lo, PT Certified Exercise Expert for the Aging Adult  07/16/19 11:50 AM Phone: [phone] Fax: [phone]

## 2019-07-19 ENCOUNTER — Other Ambulatory Visit: Payer: Self-pay

## 2019-07-19 ENCOUNTER — Ambulatory Visit: Payer: Medicare HMO | Admitting: Physical Therapy

## 2019-07-19 ENCOUNTER — Encounter: Payer: Self-pay | Admitting: Physical Therapy

## 2019-07-19 DIAGNOSIS — M6281 Muscle weakness (generalized): Secondary | ICD-10-CM

## 2019-07-19 DIAGNOSIS — M25512 Pain in left shoulder: Secondary | ICD-10-CM

## 2019-07-19 DIAGNOSIS — M25612 Stiffness of left shoulder, not elsewhere classified: Secondary | ICD-10-CM | POA: Diagnosis not present

## 2019-07-19 NOTE — Therapy (Addendum)
[place], [place], [address] Phone: [phone]   Fax:  [phone]  Physical Therapy Treatment  Patient Details  Name: Tony Hinton MRN: [id number] Date of Birth: August 03, 1941 Referring Provider (PT): Poggi, Marshall Cork MD Lattie Corns PA   Encounter Date: 07/19/2019  PT End of Session - 07/19/19 1212    Visit Number  11    Number of Visits  26    Date for PT Re-Evaluation  09/10/19    Authorization Type  [employer]na Medicare  KX modifier after 15  visits and progress note after 19th visit    Authorization - Visit Number  11    Authorization - Number of Visits  26    PT Start Time  1104    PT Stop Time  1147    PT Time Calculation (min)  43 min    Activity Tolerance  Patient tolerated treatment well    Behavior During Therapy  [facility] for tasks assessed/performed       Past Medical History:  Diagnosis Date  . Anemia   . Arthritis   . Asthma    well controlled  . 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 COPD (chronic obstructive pulmonary disease) ([place])   . Dyspnea    chronic  . Factor 5 Leiden mutation, heterozygous ([place])   . Hypothyroidism   . Thyroid disease     Past Surgical History:  Procedure Laterality Date  . APPENDECTOMY    . COLON SURGERY  09/01/2014   sbo  . COLONOSCOPY  2015  . EYE SURGERY Bilateral    corneal transplants bil and cataracts bil  . KNEE SURGERY Right   . REVERSE SHOULDER ARTHROPLASTY Left 05/30/2019   Procedure: REVERSE SHOULDER ARTHROPLASTY;  Surgeon: Corky Mull, MD;  Location: [facility] ORS;  Service: Orthopedics;  Laterality: Left;  . SHOULDER SURGERY Left     There were no vitals filed for this visit.  Subjective Assessment - 07/19/19 1104    Subjective  Pt enters clinic and state    Pertinent History  Asthma, COPD, testicular CA, arthitis         [facility] PT Assessment - 07/19/19 0001      AROM   Left Shoulder Flexion  --    sideling 95                  [facility] Adult PT Treatment/Exercise - 07/19/19 0001      Self-Care   Self-Care  Other Self-Care Comments    Posture  Proper posture for lifting arm and decreased stress on LT joint     Other Self-Care Comments   positionining for sidelying exericises and for sleep at night      Shoulder Exercises: Supine   Other Supine Exercises  bench press with 2.5 lb 3 x 10 bil shoulder, cane flexion A AROM in Supine x 15,  5 lb supine LT arm extended with small circles with 2.5 lb wt for 2 min     Other Supine Exercises  elbow extension with flex 90 degrees with 2.5 lb wt 3 x 10      Shoulder Exercises: Seated   Elevation Limitations  scapular squeezes x 20    Other Seated Exercises  seated with mirror in front for straight posture with movement  with LT arm in  90 flexion on table lifting 1 to 2 inches as able 2 x 10,  then flex with PT assist to 80 flex with eccentric lowering 3 x 10 VC and TC for monitoring posture      Shoulder Exercises: Sidelying   Other Sidelying Exercises  AROM in RT sideliying with towel on table 2 x 10 and then with added yellow t band resisitance 2 x 10       Shoulder Exercises: Standing   Protraction Limitations  modified counter push up x 10             PT Education - 07/19/19 1212    Education Details  added to HEP for sidelying AROM/resistance and elbow resistance    Person(s) Educated  Patient    Methods  Explanation;Demonstration;Tactile cues;Verbal cues;Handout    Comprehension  Verbalized understanding;Returned demonstration       PT Short Term Goals - 07/12/19 1210      PT SHORT TERM GOAL #1   Title  Pt will be independent with initial HEP    Baseline  basic PROM and resisted elbow and wrist beginning AAROM    Time  4    Period  Weeks    Status  Achieved    Target Date  07/16/19      PT SHORT TERM GOAL #2   Title  Pt will be able to achieve 90 degrees flex/abd with AAROM    Baseline  PROM flex 121/abd  92, IR 60, ER 31    Time  4    Period  Weeks    Status  Achieved    Target Date  07/16/19      PT SHORT TERM GOAL #3   Title  Pt will be able to verbalize precautians for rTSR    Baseline  Pt able to verbalize precautians and need for good posture when exericising to protect joint    Time  4    Period  Weeks    Status  Achieved    Target Date  07/16/19        PT Long Term Goals - 07/12/19 1212      PT LONG TERM GOAL #1   Title  Pt will be independent with advanced HEP    Baseline  Pt is now weaned from sling and ready to progress as MD deems ready    Time  12    Period  Weeks    Status  On-going    Target Date  09/10/19      PT LONG TERM GOAL #2   Title  left shoulder AROM scaption will improve to 0-115 degrees for improved overhead reaching.    Baseline  PROM only at this time with flex 121/ abd 90    Time  12    Period  Weeks    Status  On-going    Target Date  09/10/19      PT LONG TERM GOAL #3   Title  FOTO will improve from 47% limitation   to 32 % limitation     indicating improved functional mobility.    Baseline  eval limiation 47%    Time  12    Period  Weeks    Status  On-going    Target Date  09/10/19      PT LONG TERM GOAL #4   Title  Tolerate light resistance exercises in flexion and scaption with minimal pain    Baseline  Only at [facility] at this time beginning 6 weeks post surgery    Time  12    Period  Weeks    Status  On-going    Target Date  09/10/19      PT LONG TERM GOAL #5   Title  Tony Hinton be able to return to driving and manage steering wheel safely without exacerbating pain,    Baseline  unable to drive at this time due to precautians and no active motion of Left shoulder    Time  12    Period  Weeks    Status  On-going    Target Date  09/10/19      PT LONG TERM GOAL #6   Title  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 Pt with no pain and complying with HEP. Declined TPDN today.  Will continue POC and strengthening as able     Patient will benefit from skilled therapeutic intervention in order to improve the following deficits and impairments:     Visit Diagnosis: Stiffness of left shoulder, not elsewhere classified  Left shoulder pain, unspecified chronicity  Muscle weakness (generalized)     Problem List Patient Active Problem List   Diagnosis Date Noted  . 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 Supratherapeutic INR 04/10/2019  . Enlarged prostate with urinary retention 09/08/2014  . Small bowel obstruction due to adhesions [facility]) 09/08/2014   Voncille Lo, PT Certified Exercise Expert for the Aging Adult  07/19/19 12:14 PM Phone: [phone] Fax: [place] [facility] [facility] [address] [place], [place], [address] Phone: [phone]   Fax:  [phone]  Name: LELA GOCHENAUR MRN: [id number] Date of Birth: [birth year]

## 2019-07-19 NOTE — Patient Instructions (Signed)
    Voncille Lo, PT Certified Exercise Expert for the Aging Adult  07/19/19 11:53 AM Phone: [phone] Fax: [phone]

## 2019-07-23 ENCOUNTER — Encounter: Payer: Self-pay | Admitting: Physical Therapy

## 2019-07-23 ENCOUNTER — Other Ambulatory Visit: Payer: Self-pay

## 2019-07-23 ENCOUNTER — Ambulatory Visit: Payer: Medicare HMO | Attending: Student | Admitting: Physical Therapy

## 2019-07-23 DIAGNOSIS — M25512 Pain in left shoulder: Secondary | ICD-10-CM | POA: Insufficient documentation

## 2019-07-23 DIAGNOSIS — M25612 Stiffness of left shoulder, not elsewhere classified: Secondary | ICD-10-CM | POA: Diagnosis not present

## 2019-07-23 DIAGNOSIS — M6281 Muscle weakness (generalized): Secondary | ICD-10-CM | POA: Diagnosis present

## 2019-07-23 NOTE — Therapy (Signed)
[place], [place], [address] Phone: [phone]   Fax:  [phone]  Physical Therapy Treatment  Patient Details  Name: Tony Hinton MRN: [id number] Date of Birth: 04-23-1941 Referring Provider (PT): Poggi, Marshall Cork MD Lattie Corns PA   Encounter Date: 07/23/2019  PT End of Session - 07/23/19 1057    Visit Number  12    Number of Visits  26    Date for PT Re-Evaluation  09/10/19    Authorization Type  [employer] Medicare  KX modifier after 15  visits and progress note after 19th visit    Authorization - Visit Number  12    Authorization - Number of Visits  26    PT Start Time  1056    Activity Tolerance  Patient tolerated treatment well    Behavior During Therapy  [facility] for tasks assessed/performed       Past Medical History:  Diagnosis Date  . Anemia   . Arthritis   . Asthma    well controlled  . 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 COPD (chronic obstructive pulmonary disease) ([place])   . Dyspnea    chronic  . Factor 5 Leiden mutation, heterozygous ([place])   . Hypothyroidism   . Thyroid disease     Past Surgical History:  Procedure Laterality Date  . APPENDECTOMY    . COLON SURGERY  09/01/2014   sbo  . COLONOSCOPY  2015  . EYE SURGERY Bilateral    corneal transplants bil and cataracts bil  . KNEE SURGERY Right   . REVERSE SHOULDER ARTHROPLASTY Left 05/30/2019   Procedure: REVERSE SHOULDER ARTHROPLASTY;  Surgeon: Corky Mull, MD;  Location: [facility] ORS;  Service: Orthopedics;  Laterality: Left;  . SHOULDER SURGERY Left     There were no vitals filed for this visit.  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  No/denies    Pain Score  0-No pain    Pain Location  Shoulder    Pain Orientation  Left         [facility] PT Assessment - 07/23/19 0001      AROM   Left Shoulder Flexion  64 Degrees    Left Shoulder ABduction  67 Degrees      PROM   Left Shoulder Flexion  128 Degrees   after TPDN                  [facility] Adult PT Treatment/Exercise - 07/23/19 0001      Therapeutic Activites    Therapeutic Activities  Lifting    Lifting  using empty plastic pitcher from counter to 1st shelf 18 inches . Pt needs AAROM to make the shelf  only 75% of lifting ability to 90 degree shoulder      Shoulder Exercises: Supine   Other Supine Exercises  bench press with 4 lb  and CCW circles with 4 lb and CW circles with 4 lb      Shoulder Exercises:  Standing   External Rotation  20 reps;Strengthening;Left;Theraband    Theraband Level (Shoulder External Rotation)  Level 2 (Red)    External Rotation Limitations  VC for positioning    Internal Rotation  20 reps    Theraband Level (Shoulder Internal Rotation)  Level 2 (Red)    Internal Rotation Limitations  VC for posture and correct position TC    Extension  20 reps;Strengthening;Theraband    Theraband Level (Shoulder Extension)  Level 2 (Red)    Extension Limitations  VC to neutral    Other Standing Exercises  wall clock       Shoulder Exercises: Stretch   Other Shoulder Stretches  Supine hands behind head and touch elbow to the bed. hold 10 sec x 10      Moist Heat Therapy   Number Minutes Moist Heat  10 Minutes    Moist Heat Location  Shoulder   LT     Manual Therapy   Manual Therapy  Soft tissue mobilization;Passive ROM    Manual therapy comments  skilled palpation with TPDN, isometric/ AAROM shoulder flexion in sidelying and supine with PT assist, use of yellow t band as able    Soft tissue mobilization  left lats,subscapular, teres  minor/major, pec major and minor    Passive ROM  AAROM to left shoulder and Iisometric to lt shld 80 degrees to 115       Trigger Point Dry Needling - 07/23/19 0001    Consent Given?  Yes    Education Handout Provided  Previously provided    Muscles Treated Upper Quadrant  Subscapularis;Pectoralis major;Pectoralis minor;Teres major;Teres minor;Latissimus dorsi   all left only   Other Dry Needling  60 and 40 mm    Pectoralis Major Response  Twitch response elicited;Palpable increased muscle length    Pectoralis Minor Response  Palpable increased muscle length    Subscapularis Response  Twitch response elicited;Palpable increased muscle length    Latissimus dorsi Response  Twitch response elicited;Palpable increased muscle length    Teres major Response  Palpable increased muscle length    Teres minor Response  Palpable increased muscle length             PT Short Term Goals - 07/12/19 1210      PT SHORT TERM GOAL #1   Title  Pt will be independent with initial HEP    Baseline  basic PROM and resisted elbow and wrist beginning AAROM    Time  4    Period  Weeks    Status  Achieved    Target Date  07/16/19      PT SHORT TERM GOAL #2   Title  Pt will be able to achieve 90 degrees flex/abd with AAROM    Baseline  PROM flex 121/abd 92, IR 60, ER 31    Time  4    Period  Weeks    Status  Achieved    Target Date  07/16/19      PT SHORT TERM GOAL #3   Title  Pt will be able to verbalize precautians for rTSR    Baseline  Pt able to verbalize precautians and need for good posture when exericising to protect joint    Time  4    Period  Weeks    Status  Achieved    Target Date  07/16/19        PT Long Term Goals - 07/12/19 1212      PT LONG TERM GOAL #1  Title  Pt will be independent with advanced HEP    Baseline  Pt is now weaned from sling and ready to progress as MD deems ready    Time  12    Period  Weeks    Status  On-going    Target Date  09/10/19      PT  LONG TERM GOAL #2   Title  left shoulder AROM scaption will improve to 0-115 degrees for improved overhead reaching.    Baseline  PROM only at this time with flex 121/ abd 90    Time  12    Period  Weeks    Status  On-going    Target Date  09/10/19      PT LONG TERM GOAL #3   Title  FOTO will improve from 47% limitation   to 32 % limitation     indicating improved functional mobility.    Baseline  eval limiation 47%    Time  12    Period  Weeks    Status  On-going    Target Date  09/10/19      PT LONG TERM GOAL #4   Title  Tolerate light resistance exercises in flexion and scaption with minimal pain    Baseline  Only at [facility] at this time beginning 6 weeks post surgery    Time  12    Period  Weeks    Status  On-going    Target Date  09/10/19      PT LONG TERM GOAL #5   Title  Mr.Schultzwill be able to return to driving and manage steering wheel safely without exacerbating pain,    Baseline  unable to drive at this time due to precautians and no active motion of Left shoulder    Time  12    Period  Weeks    Status  On-going    Target Date  09/10/19      PT LONG TERM GOAL #6   Title  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 Pt /PT dicussed muscle weakness is main limiting factor now that his AROM is in necesary functional AROM . LT AROM shoudeer flex 64 and abd 67 PROM lT shld flex 128   Personal Factors and Comorbidities  Age;Comorbidity 1;Comorbidity 2;Fitness    Comorbidities  Asthma, COPD, testicular CA, arthitis    Examination-Activity Limitations  Carry;Dressing;Hygiene/Grooming;Self Feeding    Examination-Participation Restrictions  Cleaning;Laundry     Clinical Decision Making  Low    Rehab Potential  Good    PT Frequency  2x / week    PT Duration  12 weeks    PT Treatment/Interventions  ADLs/Self Care Home Management;Cryotherapy;Electrical Stimulation;Iontophoresis 4mg /ml Dexamethasone;Moist Heat;Ultrasound;Neuromuscular re-education;Therapeutic exercise;Therapeutic activities;Patient/family education;Manual techniques;Taping;Passive range of motion    PT Next Visit Plan  Continue Strengthening. GOAL and FOTO next visit.    PT Home Exercise Plan  pendulum, elbow extension, seated shoulder flex on table with towel sub maximal isometrics of shld, with towel into wall, flex/ext,  ER and ADD, tableslide PROM for flex, abd, ER AAROM cane flex/abd    Consulted and Agree with Plan of Care  Patient       Patient will benefit from skilled therapeutic intervention in order to improve the following deficits and impairments:  Postural dysfunction, Pain, Impaired UE functional use, Decreased activity tolerance, Decreased range of motion, Decreased strength  Visit Diagnosis: Stiffness of left shoulder, not elsewhere classified  Left shoulder pain, unspecified chronicity  Muscle weakness (generalized)     Problem List Patient Active Problem List   Diagnosis Date Noted  . 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 Supratherapeutic INR 04/10/2019  . Enlarged prostate with urinary retention 09/08/2014  . Small bowel obstruction due to adhesions [facility]) 09/08/2014   Voncille Lo, PT Certified Exercise Expert for the Aging Adult  07/23/19 1:13 PM Phone: [phone] Fax: [place] [facility] [address] [place], [place], [address] Phone: [phone]   Fax:  [phone]  Name: Tony Hinton MRN: [id number] Date of Birth: March 25, 1941

## 2019-07-26 ENCOUNTER — Ambulatory Visit: Payer: Medicare HMO | Admitting: Physical Therapy

## 2019-07-26 ENCOUNTER — Other Ambulatory Visit: Payer: Self-pay

## 2019-07-26 ENCOUNTER — Encounter: Payer: Self-pay | Admitting: Physical Therapy

## 2019-07-26 DIAGNOSIS — M25512 Pain in left shoulder: Secondary | ICD-10-CM

## 2019-07-26 DIAGNOSIS — M25612 Stiffness of left shoulder, not elsewhere classified: Secondary | ICD-10-CM | POA: Diagnosis not present

## 2019-07-26 DIAGNOSIS — M6281 Muscle weakness (generalized): Secondary | ICD-10-CM

## 2019-07-26 NOTE — Therapy (Signed)
[place], [place], [address] Phone: [phone]   Fax:  [phone]  Physical Therapy Treatment  Patient Details  Name: Tony Hinton MRN: [id number] Date of Birth: [birth year] Referring Provider (PT): Poggi, Marshall Cork MD Lattie Corns PA   Encounter Date: 07/26/2019  PT End of Session - 07/26/19 1052    Visit Number  13    Number of Visits  26    Date for PT Re-Evaluation  09/10/19    Authorization Type  [employer]na Medicare  KX modifier after 15  visits and progress note after 19th visit    Authorization - Visit Number  13    Authorization - Number of Visits  26    PT Start Time  1100    PT Stop Time  1155    PT Time Calculation (min)  55 min    Activity Tolerance  Patient tolerated treatment well    Behavior During Therapy  [facility] for tasks assessed/performed       Past Medical History:  Diagnosis Date  . Anemia   . Arthritis   . Asthma    well controlled  . 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 COPD (chronic obstructive pulmonary disease) ([place])   . Dyspnea    chronic  . Factor 5 Leiden mutation, heterozygous ([place])   . Hypothyroidism   . Thyroid disease     Past Surgical History:  Procedure Laterality Date  . APPENDECTOMY    . COLON SURGERY  09/01/2014   sbo  . COLONOSCOPY  2015  . EYE SURGERY Bilateral    corneal transplants bil and cataracts bil  . KNEE SURGERY Right   . REVERSE SHOULDER ARTHROPLASTY Left 05/30/2019   Procedure: REVERSE SHOULDER ARTHROPLASTY;  Surgeon: Corky Mull, MD;  Location: [facility] ORS;  Service: Orthopedics;  Laterality: Left;  . SHOULDER SURGERY Left     There were no vitals filed for this visit.  Subjective Assessment - 07/26/19 1104    Subjective  I didnt even take a pill this morning for PT.  That is how much confidence  I have.  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  No/denies    Pain Score  0-No pain    Pain Location  Shoulder    Pain Orientation  Right         [facility] PT Assessment - 07/26/19 0001      Observation/Other Assessments   Focus on Therapeutic Outcomes (FOTO)   limitation 55%   not consistent with improvement since eval     AROM   Left Shoulder Flexion  69 Degrees    Left Shoulder ABduction  70 Degrees    Left Shoulder Internal Rotation  --   to gluteal crease   Left Shoulder External Rotation  --   fingers to C-5/c-6   Left Elbow Flexion  33    Left Elbow Extension  10      PROM   Left Shoulder Flexion  128 Degrees    Left Shoulder ABduction  105  Degrees    Left Shoulder Internal Rotation  60 Degrees    Left Shoulder External Rotation  31 Degrees      Strength   Left Shoulder Flexion  3-/5    Left Shoulder Extension  3-/5    Left Shoulder ABduction  3-/5                   [facility] Adult PT Treatment/Exercise - 07/26/19 0001      Therapeutic Activites    Therapeutic Activities  Work Simulation    Work Simulation  trying to lift arm above 90, needs to use RT arm to assist and using velco key/pincer grasp to simulate changing equipment in order to retunrn to work.  used simulation of rake to use in yard and being aware of precautions      Shoulder Exercises: Sidelying   Other Sidelying Exercises  AROM in RT sideliying with towel on table 2 x 10 and then with added red t band resisitance 2 x 10       Shoulder Exercises: Standing   Protraction Limitations  wall push up    External Rotation  20 reps;Strengthening;Left;Theraband    Theraband Level (Shoulder External Rotation)  Level 2 (Red)    External Rotation Limitations  1/2 range ability due to strength    Internal Rotation  20 reps     Theraband Level (Shoulder Internal Rotation)  Level 2 (Red)    Internal Rotation Limitations  VC for posture and correct position TC    Extension  20 reps;Strengthening;Theraband   added to HEP   Theraband Level (Shoulder Extension)  Level 2 (Red)    Extension Limitations  VC to neutral    Row  10 reps    Theraband Level (Shoulder Row)  Level 2 (Red)    Row Limitations  x2 VC    Other Standing Exercises  wall clock wall climbing and use of eccentric lowering using wall and cane    Other Standing Exercises  standing cane  PT lifts to 90 degree flexion and patient lowers 2 x 10       Manual Therapy   Manual Therapy  Soft tissue mobilization;Passive ROM    Passive ROM  isometric at varying angles and perturbations with pt in supine and sidelying  use of cane in flex in 90 degrees and eccentric lowering in standing             PT Education - 07/26/19 1216    Education Details  reduced HEP and gave new strengthening HEP.  Some work simulating work station Nurse, adult) Educated  Patient    Methods  Explanation;Tactile cues;Demonstration;Verbal cues;Handout    Comprehension  Verbalized understanding;Returned demonstration       PT Short Term Goals - 07/12/19 1210      PT SHORT TERM GOAL #1   Title  Pt will be independent with initial HEP    Baseline  basic PROM and resisted elbow and wrist beginning AAROM    Time  4    Period  Weeks    Status  Achieved    Target Date  07/16/19      PT SHORT TERM GOAL #2   Title  Pt will be able to achieve 90 degrees flex/abd with AAROM    Baseline  PROM flex 121/abd 92, IR 60, ER 31    Time  4    Period  Weeks    Status  Achieved    Target  Date  07/16/19      PT SHORT TERM GOAL #3   Title  Pt will be able to verbalize precautians for rTSR    Baseline  Pt able to verbalize precautians and need for good posture when exericising to protect joint    Time  4    Period  Weeks    Status  Achieved    Target Date  07/16/19         PT Long Term Goals - 07/26/19 1226      PT LONG TERM GOAL #1   Title  Pt will be independent with advanced HEP    Baseline  Pt doing advanced exercises for strength and AROM    Time  12    Period  Weeks    Status  On-going    Target Date  09/10/19      PT LONG TERM GOAL #2   Title  left shoulder AROM scaption will improve to 0-115 degrees for improved overhead reaching.    Baseline  PROM only at this time with flex 130 / abd 105; AROM flex 69/abd 70    Time  12    Period  Weeks    Status  On-going    Target Date  09/10/19      PT LONG TERM GOAL #3   Title  FOTO will improve from 47% limitation   to 32 % limitation     indicating improved functional mobility.    Baseline  eval limiation 47%  07-26-19 now 55%  not indicative of progress made    Time  12    Period  Weeks    Status  On-going    Target Date  09/10/19      PT LONG TERM GOAL #4   Title  Tolerate light resistance exercises in flexion and scaption with minimal pain    Baseline  Pt doing AROM in sidelying and standing  3-/5 MMT given red t band/yellow t band    Time  12    Period  Weeks    Status  On-going    Target Date  09/10/19      PT LONG TERM GOAL #5   Title  Tony Hinton be able to return to driving and manage steering wheel safely without exacerbating pain,    Baseline  unable to drive at this time due to precautians and weakness  of Left shoulder    Time  12    Period  Weeks    Status  On-going    Target Date  09/08/19      PT LONG TERM GOAL #6   Title  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  AROM is improved but not functional for return to work.   AROM LT flex 69 and abd 70  Pt able to PROM flex 130 /abd 105. FOTO limiation 55% which is worse than eval at 47% and is not indicative of progress made.  Pt is dilligent to do exericses.  ALL /STG achievd and no LTG acheived but working toward goals.  Will continue POC    Personal Factors and Comorbidities  Age;Comorbidity 1;Comorbidity 2;Fitness    Comorbidities  Asthma, COPD, testicular CA, arthitis    Examination-Activity Limitations  Carry;Dressing;Hygiene/Grooming;Self Feeding    Examination-Participation Restrictions  Cleaning;Laundry    Clinical Decision Making  Low    Rehab Potential  Good    PT Frequency  2x / week    PT Duration  12 weeks    PT Treatment/Interventions  ADLs/Self Care Home Management;Cryotherapy;Electrical Stimulation;Iontophoresis 4mg /ml Dexamethasone;Moist Heat;Ultrasound;Neuromuscular re-education;Therapeutic exercise;Therapeutic activities;Patient/family education;Manual techniques;Taping;Passive range of motion    PT Next Visit Plan  Work simulation    PT Home Exercise Plan  pendulum, elbow extension, seated shoulder flex on table with towel sub maximal isometrics of shld, with towel into wall, flex/ext, ER and ADD, tableslide PROM for flex, abd, ER AAROM cane flex/abd  [id number]    Consulted and Agree with Plan of Care  Patient       Patient will benefit from skilled therapeutic intervention in order to improve the following deficits and impairments:  Postural dysfunction, Pain, Impaired UE functional use, Decreased activity tolerance, Decreased range of motion, Decreased strength  Visit Diagnosis: Stiffness of left shoulder, not elsewhere classified  Left shoulder pain, unspecified chronicity  Muscle weakness (generalized)     Problem List Patient Active Problem List   Diagnosis Date Noted  . 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 Supratherapeutic INR 04/10/2019  . Enlarged prostate with urinary retention 09/08/2014  . Small  bowel obstruction due to adhesions [facility]) 09/08/2014    Voncille Lo, PT Certified Exercise Expert for the Aging Adult  07/26/19 12:38 PM Phone: [phone] Fax: [place] [facility] [address] [place], [place], [address] Phone: [phone]   Fax:  [phone]  Name: Tony Hinton MRN: [id number] Date of Birth: [birth year]

## 2019-07-26 NOTE — Patient Instructions (Signed)
               Voncille Lo, PT Certified Exercise Expert for the Aging Adult  07/26/19 12:16 PM Phone: [phone] Fax: [phone]

## 2019-07-30 ENCOUNTER — Other Ambulatory Visit: Payer: Self-pay

## 2019-07-30 ENCOUNTER — Ambulatory Visit: Payer: Medicare HMO | Admitting: Physical Therapy

## 2019-07-30 DIAGNOSIS — M25512 Pain in left shoulder: Secondary | ICD-10-CM

## 2019-07-30 DIAGNOSIS — M25612 Stiffness of left shoulder, not elsewhere classified: Secondary | ICD-10-CM

## 2019-07-30 DIAGNOSIS — M6281 Muscle weakness (generalized): Secondary | ICD-10-CM

## 2019-07-30 NOTE — Therapy (Signed)
[place], [place], [address] Phone: [phone]   Fax:  [phone]  Physical Therapy Treatment  Patient Details  Name: Tony Hinton MRN: [id number] Date of Birth: 01/24/1941 Referring Provider (PT): Poggi, Marshall Cork MD Lattie Corns PA   Encounter Date: 07/30/2019  PT End of Session - 07/30/19 1105    Visit Number  14    Number of Visits  26    Date for PT Re-Evaluation  09/10/19    Authorization Type  [employer] Medicare  KX modifier after 15  visits and progress note after 19th visit    Authorization - Visit Number  14    Authorization - Number of Visits  26    PT Start Time  1102    PT Stop Time  1145    PT Time Calculation (min)  43 min    Activity Tolerance  Patient tolerated treatment well    Behavior During Therapy  [facility] for tasks assessed/performed       Past Medical History:  Diagnosis Date  . Anemia   . Arthritis   . Asthma    well controlled  . 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 COPD (chronic obstructive pulmonary disease) ([place])   . Dyspnea    chronic  . Factor 5 Leiden mutation, heterozygous ([place])   . Hypothyroidism   . Thyroid disease     Past Surgical History:  Procedure Laterality Date  . APPENDECTOMY    . COLON SURGERY  09/01/2014   sbo  . COLONOSCOPY  2015  . EYE SURGERY Bilateral    corneal transplants bil and cataracts bil  . KNEE SURGERY Right   . REVERSE SHOULDER ARTHROPLASTY Left 05/30/2019   Procedure: REVERSE SHOULDER ARTHROPLASTY;  Surgeon: Corky Mull, MD;  Location: [facility] ORS;  Service: Orthopedics;  Laterality: Left;  . SHOULDER SURGERY Left     There were no vitals filed for this visit.  Subjective Assessment - 07/30/19 1108    Subjective  I started out sleeping on my RT side and I ended up on my LT side. and I didn;t wake up.    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  No/denies    Pain Score  0-No pain    Pain Location  Shoulder    Pain Orientation  Right    Pain Descriptors / Indicators  Aching    Pain Onset  More than a month ago    Pain Frequency  Occasional                       [facility] Adult PT Treatment/Exercise - 07/30/19 0001      Shoulder Exercises: Supine   Other Supine Exercises  bench press with 5 lb  x 20 and CCW circles with 5 lb and CW circles with 5 lb      Other Supine Exercises  elbow extension with flex 90 degrees with 5lb wt 3 x 10      Shoulder Exercises: Seated   Other Seated Exercises  pulleys bil flex and abd 3 mnutes VC and the ex pad on seat for posture  Shoulder Exercises: Sidelying   Other Sidelying Exercises  AROM in sidelying pushing 5 lb weight to 90 -100 degrees flexion as able 3x 10 with 5lb weight.      Shoulder Exercises: Standing   Protraction Limitations  wall push up    External Rotation  20 reps;Strengthening;Left;Theraband    Theraband Level (Shoulder External Rotation)  Level 2 (Red)    External Rotation Limitations  1/2 range ability due to strength    Internal Rotation  20 reps    Theraband Level (Shoulder Internal Rotation)  Level 2 (Red)    Internal Rotation Limitations  VC for posture and correct position TC    Extension  20 reps;Strengthening;Theraband    Theraband Level (Shoulder Extension)  Level 3 (Green)    Extension Limitations  VC to neutral    Row  10 reps    Theraband Level (Shoulder Row)  Level 2 (Red)    Row Limitations  x2 VC    Shoulder Elevation Limitations  use of red t band with strap on floor  and flex and abduction    Other Standing Exercises  eccentric lowering of LT arm with PT assist  from 90 flexion ,   pt able to maintain hold and slowly lower but cannot concent   Other Standing Exercises  wall ladder 2  minutes abd and flex     3 slots form top     Shoulder Exercises: Stretch   Other Shoulder Stretches  Supine hands behind head and touch elbow to the bed. hold 10 sec x 10      Manual Therapy   Manual Therapy  Soft tissue mobilization;Passive ROM    Passive ROM  isometric at varying angles and perturbations with pt in supine and sidelying  use of cane in flex in 90 degrees and eccentric lowering in standing               PT Short Term Goals - 07/12/19 1210      PT SHORT TERM GOAL #1   Title  Pt will be independent with initial HEP    Baseline  basic PROM and resisted elbow and wrist beginning AAROM    Time  4    Period  Weeks    Status  Achieved    Target Date  07/16/19      PT SHORT TERM GOAL #2   Title  Pt will be able to achieve 90 degrees flex/abd with AAROM    Baseline  PROM flex 121/abd 92, IR 60, ER 31    Time  4    Period  Weeks    Status  Achieved    Target Date  07/16/19      PT SHORT TERM GOAL #3   Title  Pt will be able to verbalize precautians for rTSR    Baseline  Pt able to verbalize precautians and need for good posture when exericising to protect joint    Time  4    Period  Weeks    Status  Achieved    Target Date  07/16/19        PT Long Term Goals - 07/26/19 1226      PT LONG TERM GOAL #1   Title  Pt will be independent with advanced HEP    Baseline  Pt doing advanced exercises for strength and AROM    Time  12    Period  Weeks    Status  On-going    Target Date  09/10/19      PT LONG TERM GOAL #2   Title  left shoulder AROM scaption will improve to 0-115 degrees for improved overhead reaching.    Baseline  PROM only at this time with flex 130 / abd 105; AROM flex 69/abd 70    Time  12    Period  Weeks    Status  On-going    Target Date  09/10/19      PT LONG TERM GOAL #3   Title  FOTO will improve from 47% limitation   to 32 % limitation     indicating improved functional mobility.    Baseline  eval limiation 47%  07-26-19 now  55%  not indicative of progress made    Time  12    Period  Weeks    Status  On-going    Target Date  09/10/19      PT LONG TERM GOAL #4   Title  Tolerate light resistance exercises in flexion and scaption with minimal pain    Baseline  Pt doing AROM in sidelying and standing  3-/5 MMT given red t band/yellow t band    Time  12    Period  Weeks    Status  On-going    Target Date  09/10/19      PT LONG TERM GOAL #5   Title  Mr.Schultzwill be able to return to driving and manage steering wheel safely without exacerbating pain,    Baseline  unable to drive at this time due to precautians and weakness  of Left shoulder    Time  12    Period  Weeks    Status  On-going    Target Date  09/08/19      PT LONG TERM GOAL #6   Title  Pt will be able to use 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 Supratherapeutic INR 04/10/2019  . Enlarged prostate with urinary retention 09/08/2014  . Small bowel obstruction due to adhesions [facility]) 09/08/2014    Voncille Lo, PT Certified Exercise Expert for the Aging Adult  07/30/19 2:33 PM Phone: [phone] Fax: [facility] [address] [place], [place], [address] Phone: [phone]   Fax:  [phone]  Name: Tony Hinton MRN: [id number] Date of Birth: 12-13-1940

## 2019-08-02 ENCOUNTER — Ambulatory Visit: Payer: Medicare HMO | Admitting: Physical Therapy

## 2019-08-02 ENCOUNTER — Other Ambulatory Visit: Payer: Self-pay

## 2019-08-02 ENCOUNTER — Encounter: Payer: Self-pay | Admitting: Physical Therapy

## 2019-08-02 DIAGNOSIS — M25612 Stiffness of left shoulder, not elsewhere classified: Secondary | ICD-10-CM

## 2019-08-02 DIAGNOSIS — M25512 Pain in left shoulder: Secondary | ICD-10-CM

## 2019-08-02 DIAGNOSIS — M6281 Muscle weakness (generalized): Secondary | ICD-10-CM

## 2019-08-02 NOTE — Therapy (Signed)
[place], [place], [address] Phone: [phone]   Fax:  [phone]  Physical Therapy Treatment  Patient Details  Name: Tony Hinton MRN: [id number] Date of Birth: 07-Jul-1941 Referring Provider (PT): Poggi, Tony Cork MD Tony Corns PA   Encounter Date: 08/02/2019  PT End of Session - 08/02/19 1111    Visit Number  15    Number of Visits  26    Date for PT Re-Evaluation  09/10/19    Authorization Type  [employer]na Medicare  KX modifier after 15  visits and progress note after 19th visit    Authorization - Visit Number  15    Authorization - Number of Visits  26    PT Start Time  1102    PT Stop Time  1145    PT Time Calculation (min)  43 min    Activity Tolerance  Patient tolerated treatment well    Behavior During Therapy  [facility] for tasks assessed/performed       Past Medical History:  Diagnosis Date  . Anemia   . Arthritis   . Asthma    well controlled  . 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 COPD (chronic obstructive pulmonary disease) ([place])   . Dyspnea    chronic  . Factor 5 Leiden mutation, heterozygous ([place])   . Hypothyroidism   . Thyroid disease     Past Surgical History:  Procedure Laterality Date  . APPENDECTOMY    . COLON SURGERY  09/01/2014   sbo  . COLONOSCOPY  2015  . EYE SURGERY Bilateral    corneal transplants bil and cataracts bil  . KNEE SURGERY Right   . REVERSE SHOULDER ARTHROPLASTY Left 05/30/2019   Procedure: REVERSE SHOULDER ARTHROPLASTY;  Surgeon: Tony Mull, MD;  Location: [facility] ORS;  Service: Orthopedics;  Laterality: Left;  . SHOULDER SURGERY Left     There were no vitals filed for this visit.  Subjective Assessment - 08/02/19 1108    Subjective  I got a rope and made a pulley for $5  Better than paying for one for $25. 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  No/denies    Pain Score  0-No pain    Pain Location  Shoulder    Pain Orientation  Right    Pain Descriptors / Indicators  Aching    Pain Onset  More than a month ago    Pain Frequency  Occasional         [facility] PT Assessment - 08/02/19 0001      AROM   Left Shoulder Flexion  70 Degrees    Left Shoulder ABduction  70 Degrees      PROM   Left Shoulder Flexion  128 Degrees    Left Shoulder ABduction  105 Degrees                   [facility] Adult PT Treatment/Exercise - 08/02/19 0001      Therapeutic Activites    Therapeutic Activities  Work Web designer  not lifting more than 15 lb from floor KB  Work Economist  trying to Emergency planning/management officer level 69. Pt must assist with RT arm to lift over head.  Pt with mod control eccentrically ; discussed modifications including using a stool to decrease need for reaching above head.       Shoulder Exercises: Standing   Protraction Limitations  wall push up    Horizontal ABduction  Both;15 reps;Theraband    Theraband Level (Shoulder Horizontal ABduction)  Level 4 (Blue)    Horizontal ABduction Limitations  Pt with decreased ER with horiz abduction pt with low horizontal abd x 15 bil    Shoulder Elevation Limitations  standing with green physioball flex and abd 20 x each  use of small ball on wall with CCW and CW circles.  assisting to reach higher flexi and abduciton with RT arm assisting.      Other Standing Exercises  reaching on door frame with LT UE with RT UE assisting    Other Standing Exercises  wall ladder 2 minutes abd and flex     2 slots form top            PT Education - 08/02/19 1208    Education Details  added HEP for closed chain LT UE exericses using tband and small ball on wall, Work simulation problem solving utilizing stool     Person(s) Educated  Patient    Methods  Explanation;Demonstration;Tactile cues;Verbal cues;Handout    Comprehension  Verbalized understanding;Returned demonstration       PT Short Term Goals - 07/12/19 1210      PT SHORT TERM GOAL #1   Title  Pt will be independent with initial HEP    Baseline  basic PROM and resisted elbow and wrist beginning AAROM    Time  4    Period  Weeks    Status  Achieved    Target Date  07/16/19      PT SHORT TERM GOAL #2   Title  Pt will be able to achieve 90 degrees flex/abd with AAROM    Baseline  PROM flex 121/abd 92, IR 60, ER 31    Time  4    Period  Weeks    Status  Achieved    Target Date  07/16/19      PT SHORT TERM GOAL #3   Title  Pt will be able to verbalize precautians for rTSR    Baseline  Pt able to verbalize precautians and need for good posture when exericising to protect joint    Time  4    Period  Weeks    Status  Achieved    Target Date  07/16/19        PT Long Term Goals - 07/26/19 1226      PT LONG TERM GOAL #1   Title  Pt will be independent with advanced HEP    Baseline  Pt doing advanced exercises for strength and AROM    Time  12    Period  Weeks    Status  On-going    Target Date  09/10/19      PT LONG TERM GOAL #2   Title  left shoulder AROM scaption will improve to 0-115 degrees for improved overhead reaching.    Baseline  PROM only at this time with flex 130 / abd 105; AROM flex 69/abd 70    Time  12    Period  Weeks    Status  On-going    Target Date  09/10/19  PT LONG TERM GOAL #3   Title  FOTO will improve from 47% limitation   to 32 % limitation     indicating improved functional mobility.    Baseline  eval limiation 47%  07-26-19 now 55%  not indicative of progress made    Time  12    Period  Weeks    Status  On-going    Target Date  09/10/19      PT LONG TERM GOAL #4   Title  Tolerate light resistance exercises in flexion and scaption with minimal pain    Baseline  Pt doing AROM in  sidelying and standing  3-/5 MMT given red t band/yellow t band    Time  12    Period  Weeks    Status  On-going    Target Date  09/10/19      PT LONG TERM GOAL #5   Title  Mr.Schultzwill be able to return to driving and manage steering wheel safely without exacerbating pain,    Baseline  unable to drive at this time due to precautians and weakness  of Left shoulder    Time  12    Period  Weeks    Status  On-going    Target Date  09/08/19      PT LONG TERM GOAL #6   Title  Pt will be able to use arms to 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  Pt has fair strength in LT shld but unable to raise above 70 degrees AROM flexi or abd,  Pt has adequate PROM, Strength is limiting factore.  Pt given closed chain HEP today to work on.  will continue to check goals next visit    Personal Factors and Comorbidities  Age;Comorbidity 1;Comorbidity 2;Fitness    Comorbidities  Asthma, COPD, testicular CA, arthitis    Examination-Activity Limitations  Carry;Dressing;Hygiene/Grooming;Self Feeding    Examination-Participation Restrictions  Cleaning;Laundry    Stability/Clinical Decision Making  Stable/Uncomplicated    Clinical Decision Making  Low    Rehab Potential  Good    PT Frequency  2x / week    PT Duration  12 weeks    PT Treatment/Interventions  ADLs/Self Care Home Management;Cryotherapy;Electrical Stimulation;Iontophoresis 4mg /ml Dexamethasone;Moist Heat;Ultrasound;Neuromuscular re-education;Therapeutic exercise;Therapeutic activities;Patient/family education;Manual techniques;Taping;Passive range of motion    PT Next Visit Plan  Goal next visit, FOTO    PT Home Exercise  Plan  [id number] pendulum, elbow extension, seated shoulder flex on table with towel sub maximal isometrics of shld, with towel into wall, flex/ext, ER and ADD, tableslide PROM for flex, abd, ER AAROM cane flex/abd  [id number]       Patient will benefit from skilled therapeutic intervention in order to improve the following deficits and impairments:  Postural dysfunction, Pain, Impaired UE functional use, Decreased activity tolerance, Decreased range of motion, Decreased strength  Visit Diagnosis: Stiffness of left shoulder, not elsewhere classified  Left shoulder pain, unspecified chronicity  Muscle weakness (generalized)     Problem List Patient Active Problem List  Diagnosis Date Noted  . 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 Supratherapeutic INR 04/10/2019  . Enlarged prostate with urinary retention 09/08/2014  . Small bowel obstruction due to adhesions [facility]) 09/08/2014    Voncille Lo, PT Certified Exercise Expert for the Aging Adult  08/02/19 12:09 PM Phone: [phone] Fax: [facility] [address] [place], [place], [address] Phone: [phone]   Fax:  [phone]  Name: Tony Hinton MRN: [id number] Date of Birth: 09/24/1940

## 2019-08-02 NOTE — Patient Instructions (Signed)
         Voncille Lo, PT Certified Exercise Expert for the Aging Adult  08/02/19 11:42 AM Phone: [phone] Fax: [phone]

## 2019-08-06 ENCOUNTER — Ambulatory Visit: Payer: Medicare HMO | Admitting: Physical Therapy

## 2019-08-06 ENCOUNTER — Other Ambulatory Visit: Payer: Self-pay

## 2019-08-06 ENCOUNTER — Encounter: Payer: Self-pay | Admitting: Physical Therapy

## 2019-08-06 DIAGNOSIS — M25512 Pain in left shoulder: Secondary | ICD-10-CM

## 2019-08-06 DIAGNOSIS — M6281 Muscle weakness (generalized): Secondary | ICD-10-CM

## 2019-08-06 DIAGNOSIS — M25612 Stiffness of left shoulder, not elsewhere classified: Secondary | ICD-10-CM | POA: Diagnosis not present

## 2019-08-06 NOTE — Therapy (Addendum)
[place], [place], [address] Phone: [phone]   Fax:  [phone]  Physical Therapy Treatment  Patient Details  Name: Tony Hinton MRN: [id number] Date of Birth: [birth year] Referring Provider (PT): Poggi, Marshall Cork MD Lattie Corns PA   Encounter Date: 08/06/2019  PT End of Session - 08/06/19 1150    Visit Number  16    Number of Visits  26    Date for PT Re-Evaluation  09/10/19    Authorization Type  [employer] Medicare  KX modifier after 15  visits and progress note after 19th visit    Authorization - Visit Number  16    Authorization - Number of Visits  26    PT Start Time  1102    PT Stop Time  1145    PT Time Calculation (min)  43 min    Activity Tolerance  Patient tolerated treatment well    Behavior During Therapy  [facility] for tasks assessed/performed       Past Medical History:  Diagnosis Date  . Anemia   . Arthritis   . Asthma    well controlled  . 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 COPD (chronic obstructive pulmonary disease) ([place])   . Dyspnea    chronic  . Factor 5 Leiden mutation, heterozygous ([place])   . Hypothyroidism   . Thyroid disease     Past Surgical History:  Procedure Laterality Date  . APPENDECTOMY    . COLON SURGERY  09/01/2014   sbo  . COLONOSCOPY  2015  . EYE SURGERY Bilateral    corneal transplants bil and cataracts bil  . KNEE SURGERY Right   . REVERSE SHOULDER ARTHROPLASTY Left 05/30/2019   Procedure: REVERSE SHOULDER ARTHROPLASTY;  Surgeon: Corky Mull, MD;  Location: [facility] ORS;  Service: Orthopedics;  Laterality: Left;  . SHOULDER SURGERY Left     There were no vitals filed for this visit.  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  No/denies    Pain Score  0-No pain    Pain Location  Shoulder    Pain Orientation  Left    Pain Descriptors / Indicators  Aching    Pain Type  Surgical pain    Pain Onset  More than a month ago    Pain Frequency  Rarely   sometimes sore for a short time after I awake        [facility] PT Assessment - 08/06/19 0001      AROM   Left Shoulder Flexion  70 Degrees    Left Shoulder ABduction  75 Degrees    Left Elbow Flexion  150   using 5 lb wt   Left Elbow Extension  10      PROM   Left Shoulder Flexion  130 Degrees   max   Left Shoulder ABduction  105 Degrees    Left Shoulder Internal Rotation  60 Degrees    Left Shoulder External Rotation  35 Degrees      Strength   Overall  Strength  Deficits    Left Shoulder Flexion  3-/5    Left Shoulder Extension  3-/5    Left Shoulder ABduction  3-/5    Left Shoulder External Rotation  2-/5                   [facility] Adult PT Treatment/Exercise - 08/06/19 0001      Self-Care   Self-Care  Other Self-Care Comments    Other Self-Care Comments   discussing progression of protocol and expectations      Shoulder Exercises: Standing   Other Standing Exercises  standing with UE flexion alternating x 10  to increase thoracic extension.  also UE alternating with alternatin hip extension x 10, thoracic stretching  using sink squat and RT UE extendied x 3 30 sec hold             PT Education - 08/06/19 1145    Education Details  added to HEP and advised pt to utilized AAROM to aid LT shoulder to 90degrees and eccentric lowering,  protocol and expectations    Person(s) Educated  Patient    Methods  Explanation;Demonstration;Tactile cues;Verbal cues;Handout    Comprehension  Verbalized understanding;Returned demonstration       PT Short Term Goals - 07/12/19 1210      PT SHORT TERM GOAL #1   Title  Pt will be independent  with initial HEP    Baseline  basic PROM and resisted elbow and wrist beginning AAROM    Time  4    Period  Weeks    Status  Achieved    Target Date  07/16/19      PT SHORT TERM GOAL #2   Title  Pt will be able to achieve 90 degrees flex/abd with AAROM    Baseline  PROM flex 121/abd 92, IR 60, ER 31    Time  4    Period  Weeks    Status  Achieved    Target Date  07/16/19      PT SHORT TERM GOAL #3   Title  Pt will be able to verbalize precautians for rTSR    Baseline  Pt able to verbalize precautians and need for good posture when exericising to protect joint    Time  4    Period  Weeks    Status  Achieved    Target Date  07/16/19        PT Long Term Goals - 08/06/19 1146      PT LONG TERM GOAL #1   Title  Pt will be independent with advanced HEP    Baseline  Pt doing advanced exercises for strength and AROM,  Pt struggling with 3-/5 strength of LT shoulder    Time  12    Period  Weeks    Status  On-going    Target Date  09/10/19      PT LONG TERM GOAL #2   Title  left shoulder AROM scaption will improve to 0-115 degrees for improved overhead reaching.    Baseline  PROM only at this time with flex 130 / abd 105; AROM flex 70/abd 75    Time  12    Period  Weeks    Status  On-going    Target Date  09/10/19      PT LONG TERM GOAL #3   Title  FOTO will improve from 47% limitation   to 32 % limitation     indicating improved functional mobility.  Baseline  eval limiation 47%  07-26-19 now 55%  not indicative of progress made    Time  12    Period  Weeks    Status  On-going    Target Date  09/10/19      PT LONG TERM GOAL #4   Title  Tolerate light resistance exercises in flexion and scaption with minimal pain    Baseline  Pt doing AROM in sidelying and standing  3-/5 MMT given red t band/yellow t band pt limited with kyphotic posture    Time  12    Period  Weeks    Status  On-going    Target Date  09/10/19      PT LONG TERM GOAL #5   Title  Mr.Schult zwill be  able to return to driving and manage steering wheel safely without exacerbating pain,    Baseline  able to drive self    Time  12    Period  Weeks    Status  Achieved      PT LONG TERM GOAL #6   Title  Pt will be able to use arms to 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  Pt with weaknessin LT shold 3-/5 flex/abd.  Pt with 2-/5 LT ER.  LT shld PROM flex130, abd 105, ER 35 IR 60,  AROM shld flex 70, abd 75,  Pt able to AAROM LT arm to 90 and eccentrically lowering but not able to concentrically lift LT arm.  Pt with +10 elbow extension deficit with hard end feel.. Pt reinforced that rTSR will not get full AROM but we do want to get as much function as possible. LTG # 5   Pt is able to drive car.  Pt is diligent to perform exericises as home but needs monitoring to execute properly   Often performs flexin in scapular plane.  Will continue POC    Personal Factors and Comorbidities  Age;Comorbidity 1;Comorbidity 2;Fitness    Comorbidities  Asthma, COPD, testicular CA, arthitis    Examination-Activity Limitations  Carry;Dressing;Hygiene/Grooming;Self Feeding    Examination-Participation Restrictions  Cleaning;Laundry    Stability/Clinical Decision Making  Stable/Uncomplicated    Clinical Decision Making  Low    Rehab Potential  Good    PT Frequency  2x / week    PT Duration  12 weeks    PT Treatment/Interventions  ADLs/Self Care Home Management;Cryotherapy;Electrical Stimulation;Iontophoresis 4mg /ml Dexamethasone;Moist Heat;Ultrasound;Neuromuscular re-education;Therapeutic exercise;Therapeutic activities;Patient/family education;Manual techniques;Taping;Passive range of motion    PT Next Visit Plan  supine lawn chair AROM shoulder     PT Home Exercise Plan  [id number]  [id number] pendulum, elbow extension, seated shoulder flex on table with towel sub maximal isometrics of shld, with towel into wall, flex/ext, ER and ADD, tableslide PROM for flex, abd, ER AAROM cane flex/abd  [id number]    Consulted and Agree with Plan of Care  Patient       Patient will benefit from skilled therapeutic intervention in order to improve the following deficits and impairments:  Postural dysfunction, Pain, Impaired UE functional use, Decreased activity tolerance,  Decreased range of motion, Decreased strength  Visit Diagnosis: Stiffness of left shoulder, not elsewhere classified  Left shoulder pain, unspecified chronicity  Muscle weakness (generalized)     Problem List Patient Active Problem List   Diagnosis Date Noted  . 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 Supratherapeutic INR 04/10/2019  . Enlarged prostate with urinary retention 09/08/2014  . Small bowel obstruction due to adhesions [facility]) 09/08/2014   Voncille Lo, PT Certified Exercise Expert for the Aging Adult  08/06/19 5:08 PM Phone: [phone] Fax: [facility] [address] [place], [place], [address] Phone: [phone]   Fax:  [phone]  Name: ASSAD MUHAMMED MRN: [id number] Date of Birth: 1941-05-15

## 2019-08-06 NOTE — Patient Instructions (Signed)
        Tony Hinton, PT Certified Exercise Expert for the Aging Adult  08/06/19 11:45 AM Phone: [phone] Fax: [phone]

## 2019-08-09 ENCOUNTER — Encounter: Payer: Self-pay | Admitting: Physical Therapy

## 2019-08-09 ENCOUNTER — Ambulatory Visit: Payer: Medicare HMO | Admitting: Physical Therapy

## 2019-08-09 ENCOUNTER — Other Ambulatory Visit: Payer: Self-pay

## 2019-08-09 DIAGNOSIS — M6281 Muscle weakness (generalized): Secondary | ICD-10-CM

## 2019-08-09 DIAGNOSIS — M25512 Pain in left shoulder: Secondary | ICD-10-CM

## 2019-08-09 DIAGNOSIS — M25612 Stiffness of left shoulder, not elsewhere classified: Secondary | ICD-10-CM

## 2019-08-09 NOTE — Therapy (Signed)
[place], [place], [address] Phone: [phone]   Fax:  [phone]  Physical Therapy Treatment  Patient Details  Name: Tony Hinton MRN: [id number] Date of Birth: 1940/10/06 Referring Provider (PT): Poggi, Marshall Cork MD Lattie Corns PA   Encounter Date: 08/09/2019  PT End of Session - 08/09/19 1128    Visit Number  17    Number of Visits  26    Date for PT Re-Evaluation  09/10/19    Authorization Type  [employer]na Medicare  KX modifier after 15  visits and progress note after 19th visit    Authorization - Visit Number  17    PT Start Time  1106    PT Stop Time  1150    PT Time Calculation (min)  44 min    Activity Tolerance  Patient tolerated treatment well    Behavior During Therapy  [facility] for tasks assessed/performed       Past Medical History:  Diagnosis Date  . Anemia   . Arthritis   . Asthma    well controlled  . 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 COPD (chronic obstructive pulmonary disease) ([place])   . Dyspnea    chronic  . Factor 5 Leiden mutation, heterozygous ([place])   . Hypothyroidism   . Thyroid disease     Past Surgical History:  Procedure Laterality Date  . APPENDECTOMY    . COLON SURGERY  09/01/2014   sbo  . COLONOSCOPY  2015  . EYE SURGERY Bilateral    corneal transplants bil and cataracts bil  . KNEE SURGERY Right   . REVERSE SHOULDER ARTHROPLASTY Left 05/30/2019   Procedure: REVERSE SHOULDER ARTHROPLASTY;  Surgeon: Corky Mull, MD;  Location: [facility] ORS;  Service: Orthopedics;  Laterality: Left;  . SHOULDER SURGERY Left     There were no vitals filed for this visit.  Subjective Assessment - 08/09/19 1111    Subjective  I never have pain except for initially waking up but I can get rid of it by moving around. 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  No/denies    Pain Score  0-No pain    Pain Location  Shoulder    Pain Descriptors / Indicators  Aching   only initially waking up but not during the day   Pain Type  Surgical pain                       [facility] Adult PT Treatment/Exercise - 08/09/19 0001      Therapeutic Activites    Therapeutic Activities  Other Therapeutic Activities    Other Therapeutic Activities  lifting 1/4 lb cup of marbles to shelf at 18 inches from counter 5 x rest then 5 x  with greater control.  Pt to 28 in shelf with compensation and trunk lean and pulling up with fingers, x 3      Shoulder Exercises: Supine   Other Supine Exercises  bench press with 5 lb  x 20 and CCW circles with 5 lb and CW circles with 5 lb  Other Supine Exercises  elbow extension with flex 90 degrees with 5lb wt 3 x 10      Shoulder Exercises: Seated   Other Seated Exercises  incline mat at 30 degrees and lifting 2lb wt with assistance of PT and lowering 3 x 10,  1lb lifting  but must use short lever arm elbow bent at 90 degrees to raise over head.       Shoulder Exercises: Sidelying   Other Sidelying Exercises  Sidelying on RT doing ER eccentric lowering with 2 lb, with 20 degrees ER at end range    revised HEP     Shoulder Exercises: Standing   Shoulder Elevation Limitations  standing with green physioball flex and abd 20 x each  use of small ball on wall with CCW and CW circles.  assisting to reach    Other Standing Exercises  reaching on door frame with LT UE with RT UE assisting    Other Standing Exercises  standing with pulleys and allowing eccentric lowering of LT UE from 120 degrees with 30 degrees incline  3 x 10,              PT Education - 08/09/19 1151    Education Details  revised HEP to be more challenging with current exericises    Person(s)  Educated  Patient    Methods  Explanation;Demonstration;Tactile cues;Verbal cues    Comprehension  Verbalized understanding;Returned demonstration       PT Short Term Goals - 07/12/19 1210      PT SHORT TERM GOAL #1   Title  Pt will be independent with initial HEP    Baseline  basic PROM and resisted elbow and wrist beginning AAROM    Time  4    Period  Weeks    Status  Achieved    Target Date  07/16/19      PT SHORT TERM GOAL #2   Title  Pt will be able to achieve 90 degrees flex/abd with AAROM    Baseline  PROM flex 121/abd 92, IR 60, ER 31    Time  4    Period  Weeks    Status  Achieved    Target Date  07/16/19      PT SHORT TERM GOAL #3   Title  Pt will be able to verbalize precautians for rTSR    Baseline  Pt able to verbalize precautians and need for good posture when exericising to protect joint    Time  4    Period  Weeks    Status  Achieved    Target Date  07/16/19        PT Long Term Goals - 08/06/19 1146      PT LONG TERM GOAL #1   Title  Pt will be independent with advanced HEP    Baseline  Pt doing advanced exercises for strength and AROM,  Pt struggling with 3-/5 strength of LT shoulder    Time  12    Period  Weeks    Status  On-going    Target Date  09/10/19      PT LONG TERM GOAL #2   Title  left shoulder AROM scaption will improve to 0-115 degrees for improved overhead reaching.    Baseline  PROM only at this time with flex 130 / abd 105; AROM flex 70/abd 75    Time  12    Period  Weeks    Status  On-going  Target Date  09/10/19      PT LONG TERM GOAL #3   Title  FOTO will improve from 47% limitation   to 32 % limitation     indicating improved functional mobility.    Baseline  eval limiation 47%  07-26-19 now 55%  not indicative of progress made    Time  12    Period  Weeks    Status  On-going    Target Date  09/10/19      PT LONG TERM GOAL #4   Title  Tolerate light resistance exercises in flexion and scaption with minimal pain     Baseline  Pt doing AROM in sidelying and standing  3-/5 MMT given red t band/yellow t band pt limited with kyphotic posture    Time  12    Period  Weeks    Status  On-going    Target Date  09/10/19      PT LONG TERM GOAL #5   Title  Mr.Schult zwill be able to return to driving and manage steering wheel safely without exacerbating pain,    Baseline  able to drive self    Time  12    Period  Weeks    Status  Achieved      PT LONG TERM GOAL #6   Title  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  Pt with  4 more visits before DC.  Pt will need Progress note in 2 visits before visit with MD.  Mr Pasty Arch is diligent to perform exericises at home but needs reinforcement to perform at end ranges and with maximum benefit.  Pt is able to initially raise /flex left arm to 80 degreees but he fatigues.  PT spent time modifying HEP in order to maximize benefit of eccentric control.  will contiue POC    Personal Factors and Comorbidities  Age;Comorbidity 1;Comorbidity 2;Fitness    Comorbidities  Asthma, COPD, testicular CA, arthitis    Examination-Activity Limitations  Carry;Dressing;Hygiene/Grooming;Self Feeding    Examination-Participation Restrictions  Cleaning;Laundry    Stability/Clinical Decision Making  Stable/Uncomplicated    Clinical Decision Making  Low    Rehab Potential  Good    PT Frequency  2x / week    PT Duration  12 weeks    PT Treatment/Interventions  ADLs/Self Care Home Management;Cryotherapy;Electrical Stimulation;Iontophoresis 4mg /ml Dexamethasone;Moist Heat;Ultrasound;Neuromuscular re-education;Therapeutic exercise;Therapeutic activities;Patient/family education;Manual techniques;Taping;Passive  range of motion    PT Next Visit Plan  continue POC for maximizing control and strength and stability of LT shoulder    PT Home Exercise Plan  [id number]  [id number] pendulum, elbow extension, seated shoulder flex on table with towel sub maximal isometrics of shld, with towel into wall, flex/ext, ER and ADD, tableslide PROM for flex, abd, ER AAROM cane flex/abd  [id number]    Consulted and Agree with Plan of Care  Patient       Patient will benefit from skilled therapeutic intervention in order to improve the following deficits and impairments:  Postural dysfunction, Pain, Impaired UE functional use,  Decreased activity tolerance, Decreased range of motion, Decreased strength  Visit Diagnosis: Stiffness of left shoulder, not elsewhere classified  Left shoulder pain, unspecified chronicity  Muscle weakness (generalized)     Problem List Patient Active Problem List   Diagnosis Date Noted  . 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 Supratherapeutic INR 04/10/2019  . Enlarged prostate with urinary retention 09/08/2014  . Small bowel obstruction due to adhesions [facility]) 09/08/2014   Voncille Lo, PT Certified Exercise Expert for the Aging Adult  08/09/19 12:19 PM Phone: [phone] Fax: [facility] [address] [place], [place], [address] Phone: [phone]   Fax:  [phone]  Name: Tony Hinton MRN: [id number] Date of Birth: Jan 25, 1941

## 2019-08-13 ENCOUNTER — Ambulatory Visit: Payer: Medicare HMO | Admitting: Physical Therapy

## 2019-08-13 ENCOUNTER — Encounter: Payer: Self-pay | Admitting: Physical Therapy

## 2019-08-13 ENCOUNTER — Other Ambulatory Visit: Payer: Self-pay

## 2019-08-13 DIAGNOSIS — M25512 Pain in left shoulder: Secondary | ICD-10-CM

## 2019-08-13 DIAGNOSIS — M6281 Muscle weakness (generalized): Secondary | ICD-10-CM

## 2019-08-13 DIAGNOSIS — M25612 Stiffness of left shoulder, not elsewhere classified: Secondary | ICD-10-CM

## 2019-08-13 NOTE — Therapy (Signed)
[place], [place], [address] Phone: [phone]   Fax:  [phone]  Physical Therapy Treatment  Patient Details  Name: Tony Hinton MRN: [id number] Date of Birth: 1941/05/25 Referring Provider (PT): Poggi, Marshall Cork MD Lattie Corns PA   Encounter Date: 08/13/2019  PT End of Session - 08/13/19 1145    Visit Number  18    Number of Visits  26    Date for PT Re-Evaluation  09/10/19    Authorization Type  [employer] Medicare  KX modifier after 15  visits and progress note after 19th visit    Authorization - Visit Number  18    Authorization - Number of Visits  26    PT Start Time  1104    PT Stop Time  1145    PT Time Calculation (min)  41 min    Activity Tolerance  Patient tolerated treatment well    Behavior During Therapy  [facility] [place] for tasks assessed/performed       Past Medical History:  Diagnosis Date  . Anemia   . Arthritis   . Asthma    well controlled  . 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 COPD (chronic obstructive pulmonary disease) ([place])   . Dyspnea    chronic  . Factor 5 Leiden mutation, heterozygous ([place])   . Hypothyroidism   . Thyroid disease     Past Surgical History:  Procedure Laterality Date  . APPENDECTOMY    . COLON SURGERY  09/01/2014   sbo  . COLONOSCOPY  2015  . EYE SURGERY Bilateral    corneal transplants bil and cataracts bil  . KNEE SURGERY Right   . REVERSE SHOULDER ARTHROPLASTY Left 05/30/2019   Procedure: REVERSE SHOULDER ARTHROPLASTY;  Surgeon: Corky Mull, MD;  Location: [facility] ORS;  Service: Orthopedics;  Laterality: Left;  . SHOULDER SURGERY Left     There were no vitals filed for this visit.  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  No/denies    Pain Score  0-No pain    Pain Location  Shoulder    Pain Orientation  Left         [facility] PT Assessment - 08/13/19 0001      AROM   Left Shoulder Flexion  76 Degrees   able to lift to 90 and maintain static hold   Left Shoulder ABduction  75 Degrees    Left Elbow Flexion  150   using 5 lb wt   Left Elbow Extension  10                   [facility] Adult PT Treatment/Exercise - 08/13/19 0001      Shoulder Exercises: Supine   Other Supine Exercises  biceps in supine 5 lb x 20 , triceps 20 x with 5 lb    Other Supine Exercises  elbow extension with flex 90 degrees with 5lb wt 3 x 10      Shoulder Exercises: Seated   Other Seated Exercises  incline mat at 30 degrees and lifting 3lb wt with assistance of PT and lowering 3 x 10,        Shoulder Exercises: Sidelying   Other Sidelying Exercises  Sidelying on RT doing ER eccentric lowering with 2 lb, with 20 degrees ER at end range    revised HEP   Other Sidelying Exercises  sidelying on RT  abducting  with 3 lb wt x 20 VC for straightening elbow      Shoulder Exercises: Standing   Protraction Limitations  wall push up    Horizontal ABduction  Both;15 reps;Theraband    Theraband Level (Shoulder Horizontal ABduction)  Level 4 (Blue)    Shoulder Elevation Limitations  using  pulleys to maintain 90 degrees and then exxxentrically lowering LT UE      Moist Heat Therapy   Moist Heat Location  Shoulder   LT concurrent with exericise     Manual Therapy   Manual Therapy  Soft tissue mobilization    Manual therapy comments  skilled palpation  with TPDN    Soft tissue mobilization  sub scapularis, lats, periscapular mx and biceps  LT only       Trigger Point Dry Needling - 08/13/19 0001    Consent Given?  Yes    Education Handout Provided  Previously provided    Muscles Treated Upper Quadrant   Pectoralis minor;Pectoralis major;Subscapularis;Biceps;Latissimus dorsi   LT side only   Other Dry Needling  60 and 40 mm    Pectoralis Major Response  Twitch response elicited;Palpable increased muscle length    Pectoralis Minor Response  Twitch response elicited;Palpable increased muscle length    Subscapularis Response  Palpable increased muscle length    Latissimus dorsi Response  Twitch response elicited;Palpable increased muscle length    Teres major Response  Palpable increased muscle length    Biceps Response  Twitch response elicited;Palpable increased muscle length             PT Short Term Goals - 07/12/19 1210      PT SHORT TERM GOAL #1   Title  Pt will be independent with initial HEP    Baseline  basic PROM and resisted elbow and wrist beginning AAROM    Time  4    Period  Weeks    Status  Achieved    Target Date  07/16/19      PT SHORT TERM GOAL #2   Title  Pt will be able to achieve 90 degrees flex/abd with AAROM    Baseline  PROM flex 121/abd 92, IR 60, ER 31    Time  4    Period  Weeks    Status  Achieved    Target Date  07/16/19      PT SHORT TERM GOAL #3   Title  Pt will be able to verbalize precautians for rTSR    Baseline  Pt able to verbalize precautians and need for good posture when exericising to protect joint    Time  4    Period  Weeks    Status  Achieved    Target Date  07/16/19        PT Long Term Goals - 08/06/19 1146      PT LONG TERM GOAL #1   Title  Pt will be independent with advanced HEP    Baseline  Pt doing advanced exercises for strength and AROM,  Pt struggling with 3-/5 strength of LT shoulder    Time  12  Period  Weeks    Status  On-going    Target Date  09/10/19      PT LONG TERM GOAL #2   Title  left shoulder AROM scaption will improve to 0-115 degrees for improved overhead reaching.    Baseline  PROM only at this time with flex 130 / abd 105; AROM flex 70/abd 75    Time  12    Period  Weeks    Status  On-going     Target Date  09/10/19      PT LONG TERM GOAL #3   Title  FOTO will improve from 47% limitation   to 32 % limitation     indicating improved functional mobility.    Baseline  eval limiation 47%  07-26-19 now 55%  not indicative of progress made    Time  12    Period  Weeks    Status  On-going    Target Date  09/10/19      PT LONG TERM GOAL #4   Title  Tolerate light resistance exercises in flexion and scaption with minimal pain    Baseline  Pt doing AROM in sidelying and standing  3-/5 MMT given red t band/yellow t band pt limited with kyphotic posture    Time  12    Period  Weeks    Status  On-going    Target Date  09/10/19      PT LONG TERM GOAL #5   Title  Mr.Schult zwill be able to return to driving and manage steering wheel safely without exacerbating pain,    Baseline  able to drive self    Time  12    Period  Weeks    Status  Achieved      PT LONG TERM GOAL #6   Title  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  Mr Mccullers has good PROM but still struggles with weakness in surgical LT UE.  Pt AROM on LT UE flex 76, abd 75 . Pt is able to maintain 90 decrees static hold after assisting with RT hand.  Pt will need progress note for next visit before MD visit.  Will continue POC    Personal Factors and Comorbidities  Age;Comorbidity 1;Comorbidity 2;Fitness    Comorbidities  Asthma, COPD, testicular CA, arthitis    Examination-Activity Limitations  Carry;Dressing;Hygiene/Grooming;Self Feeding    Examination-Participation Restrictions  Cleaning;Laundry    Stability/Clinical Decision Making  Stable/Uncomplicated    Clinical Decision  Making  Low    Rehab Potential  Good    PT Frequency  2x / week    PT Treatment/Interventions  ADLs/Self Care Home Management;Cryotherapy;Electrical Stimulation;Iontophoresis 4mg /ml Dexamethasone;Moist Heat;Ultrasound;Neuromuscular re-education;Therapeutic exercise;Therapeutic activities;Patient/family education;Manual techniques;Taping;Passive range of motion    PT Next Visit Plan  continue POC for maximizing control and strength and stability of LT shoulder    PT Home Exercise Plan  [id number]  [id number] pendulum, elbow extension, seated shoulder flex on table with towel sub maximal  isometrics of shld, with towel into wall, flex/ext, ER and ADD, tableslide PROM for flex, abd, ER AAROM cane flex/abd  [id number]    Consulted and Agree with Plan of Care  Patient       Patient will benefit from skilled therapeutic intervention in order to improve the following deficits and impairments:  Postural dysfunction, Pain, Impaired UE functional use, Decreased activity tolerance, Decreased range of motion, Decreased strength  Visit Diagnosis: Stiffness of left shoulder, not elsewhere classified  Left shoulder pain, unspecified chronicity  Muscle weakness (generalized)     Problem List Patient Active Problem List   Diagnosis Date Noted  . 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 Supratherapeutic INR 04/10/2019  . Enlarged prostate with urinary retention 09/08/2014  . Small bowel obstruction due to adhesions [facility]) 09/08/2014   Voncille Lo, PT Certified Exercise Expert for the Aging Adult  08/13/19 5:20 PM Phone: [phone] Fax: [place] [facility] [address] [place], [place], [address] Phone: [phone]   Fax:  [phone]  Name: ARK NAZARENO MRN: [id number] Date of Birth: 1941-05-09

## 2019-08-23 ENCOUNTER — Other Ambulatory Visit: Payer: Self-pay

## 2019-08-23 ENCOUNTER — Ambulatory Visit: Payer: Medicare HMO | Attending: Student | Admitting: Physical Therapy

## 2019-08-23 DIAGNOSIS — M6281 Muscle weakness (generalized): Secondary | ICD-10-CM | POA: Diagnosis present

## 2019-08-23 DIAGNOSIS — M25612 Stiffness of left shoulder, not elsewhere classified: Secondary | ICD-10-CM | POA: Insufficient documentation

## 2019-08-23 DIAGNOSIS — M25512 Pain in left shoulder: Secondary | ICD-10-CM | POA: Insufficient documentation

## 2019-08-23 NOTE — Therapy (Signed)
[place], [place], [address] Phone: [phone]   Fax:  [phone]  Physical Therapy Treatment/Progress Note  Patient Details  Name: Tony Hinton MRN: [id number] Date of Birth: 06-26-1941 Referring Provider (PT): Poggi, Marshall Cork MD Lattie Corns PA  Progress Note Reporting Period 07-16-19 to 08-23-19  See note below for Objective Data and Assessment of Progress/Goals.      Encounter Date: 08/23/2019  PT End of Session - 08/23/19 1149    Visit Number  19    Number of Visits  26    Date for PT Re-Evaluation  09/10/19    Authorization Type  [employer]na Medicare  KX modifier after 15  visits and progress note after 29th visit    Authorization - Visit Number  18    Authorization - Number of Visits  26    PT Start Time  1102    PT Stop Time  1147    PT Time Calculation (min)  45 min    Activity Tolerance  Patient tolerated treatment well    Behavior During Therapy  WFL for tasks assessed/performed       Past Medical History:  Diagnosis Date  . Anemia   . Arthritis   . Asthma    well controlled  . 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 COPD (chronic obstructive pulmonary disease) ([place])   . Dyspnea    chronic  . Factor 5 Leiden mutation, heterozygous ([place])   . Hypothyroidism   . Thyroid disease     Past Surgical History:  Procedure Laterality Date  . APPENDECTOMY    . COLON SURGERY  09/01/2014   sbo  . COLONOSCOPY  2015  . EYE SURGERY Bilateral    corneal transplants bil and cataracts bil  . KNEE SURGERY Right   . REVERSE SHOULDER ARTHROPLASTY Left 05/30/2019   Procedure: REVERSE SHOULDER ARTHROPLASTY;  Surgeon: Corky Mull, MD;  Location: [facility] ORS;  Service: Orthopedics;  Laterality: Left;  . SHOULDER SURGERY Left     There were no vitals filed for this visit.  Subjective Assessment - 08/23/19 1147    Subjective  I am doing ok.  sometimes I wake up with  sore arms but it goes away. 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  No/denies    Pain Score  0-No pain    Pain Location  Shoulder    Pain Orientation  Left         [facility] PT Assessment - 08/23/19 0001      Assessment   Medical Diagnosis  Reverse Total Shoulder Left    Referring Provider (PT)  Poggi, Marshall Cork MD Lattie Corns PA    Onset Date/Surgical Date  05/30/19    Hand Dominance  Left      Observation/Other Assessments   Focus on Therapeutic Outcomes (FOTO)   limitation 46%  1%imporvement   not consistent with improvement since eval     AROM   Left Shoulder Flexion  82 Degrees    Left Shoulder ABduction  79 Degrees    Left Shoulder Internal Rotation  30 Degrees  to sacrum   Left Shoulder External Rotation  55 Degrees   to C6   Right Elbow Flexion  153    Right Elbow Extension  0    Left Elbow Flexion  150   using 5 lb wt   Left Elbow Extension  9      PROM   Left Shoulder Flexion  137 Degrees   max   Left Shoulder ABduction  107 Degrees    Left Shoulder Internal Rotation  63 Degrees    Left Shoulder External Rotation  40 Degrees      Strength   Left Shoulder Flexion  3-/5    Left Shoulder Extension  3-/5    Left Shoulder ABduction  3-/5    Left Shoulder External Rotation  3-/5                   [facility] Adult PT Treatment/Exercise - 08/23/19 0001      Self-Care   Self-Care  Other Self-Care Comments    Other Self-Care Comments   discussed return  to work and DC planning .  Reviewed HEP for home and use of weights safely,  explained need for AROM before loading LT shoulder      Therapeutic Activites    Other Therapeutic Activities  lifting 1/4 lb cup of marbles to shelf at 18 inches from counter 5 x rest then 5 x   with greater control.  Pt to 28 in shelf continuing to use  compensation and trunk lean and pulling up with fingers, x 3      Shoulder Exercises: Supine   Other Supine Exercises  biceps in supine 5 lb x 20 , triceps 20 x with 5 lb on incline table      Other Supine Exercises  elbow extension with flex 90 degrees with 5lb wt 3 x 10      Shoulder Exercises: Seated   Other Seated Exercises  seated lat exericses with LT being assisted in gentle stretch by PT, also seated with thoracic/lat stretch from counter 5 x 20 sec stretch      Shoulder Exercises: Sidelying   Other Sidelying Exercises  AROM in sidelying pushing 5 lb weight to 90 -100 degrees flexion as able 3x 10 with 5lb weight.      Shoulder Exercises: Standing   Horizontal ABduction  Both;15 reps;Theraband    Theraband Level (Shoulder Horizontal ABduction)  Level 2 (Red)    Extension  20 reps;Strengthening;Theraband    Theraband Level (Shoulder Extension)  Level 3 (Green)    Extension Limitations  VC to neutral    Row  10 reps    Theraband Level (Shoulder Row)  Level 2 (Red)    Row Limitations  x2 VC    Diagonals Limitations  wall climibing for flexion and abduction of LT shoulder     Shoulder Elevation Limitations  using  pulleys to maintain 90 degrees and then eccentrically lowering LT UE    Other Standing Exercises  yellow t band deltoid ant/ middle  3 x 15    Other Standing Exercises  standing eccentric lowering of LT form 95 degrees flexion 15 x with RT hand assisting by pt for HEP             PT Education - 08/23/19 1204    Education Details  added to HEP for latissimus stretching in safety, reviewed HEP for correct execution.  Discussed DC and return to work    [employer]) Educated  Patient  Methods  Explanation;Demonstration;Tactile cues;Verbal cues;Handout    Comprehension  Verbalized understanding;Returned demonstration       PT Short Term Goals - 07/12/19 1210      PT SHORT TERM GOAL #1   Title  Pt will be  independent with initial HEP    Baseline  basic PROM and resisted elbow and wrist beginning AAROM    Time  4    Period  Weeks    Status  Achieved    Target Date  07/16/19      PT SHORT TERM GOAL #2   Title  Pt will be able to achieve 90 degrees flex/abd with AAROM    Baseline  PROM flex 121/abd 92, IR 60, ER 31    Time  4    Period  Weeks    Status  Achieved    Target Date  07/16/19      PT SHORT TERM GOAL #3   Title  Pt will be able to verbalize precautians for rTSR    Baseline  Pt able to verbalize precautians and need for good posture when exericising to protect joint    Time  4    Period  Weeks    Status  Achieved    Target Date  07/16/19        PT Long Term Goals - 08/23/19 1136      PT LONG TERM GOAL #1   Title  Pt will be independent with advanced HEP    Baseline  Pt doing advanced exercises for strength and AROM,  Pt with 3-/5 of LT shld due not full range of shoulder    Time  12    Period  Weeks    Status  On-going    Target Date  09/10/19      PT LONG TERM GOAL #2   Title  left shoulder AROM scaption will improve to 0-115 degrees for improved overhead reaching.    Baseline  PROM flex 137/ abd 107; AROM flex 82/abd 79  Eccentrically can hold at 95 degrees flexion with assistance from RT hand    Time  12    Period  Weeks    Status  Partially Met    Target Date  09/10/19      PT LONG TERM GOAL #3   Title  FOTO will improve from 47% limitation   to 32 % limitation     indicating improved functional mobility.    Baseline  eval limiation 47%  08-23-19 now 46%  only 1% change - not indicative of progress madeclinically    Time  12    Period  Weeks    Status  On-going    Target Date  09/10/19      PT LONG TERM GOAL #4   Title  Tolerate light resistance exercises in flexion and scaption with minimal pain    Baseline  Pt doing AROM in sidelying and standing  3-/5 MMT(not able to do full AROM) given red t band/yellow t band pt limited with kyphotic posture. 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 will have 2 additional appt to problems solve issues at work if any present itself    Time  12    Period  Weeks    Status  Unable to assess    Target Date  09/10/19            Plan - 08/23/19 1218    Clinical Impression Statement  Pt enters clinic for 19th visit in recovery from rTSR on 05-30-19.  Pt has no pain but has limited AROM improved from eval. Tony Hinton has good PROM but still struggles with weakness in surgical LT UE.  AROM/PROM of LT shld flex 82/137, abd 79/107, IR 30/ 63, ER 40/55,  Tony Hinton is able to reach sacrum and his C6 with LT arm.  He is unable to raise LT arm concentrically to 90, but hi is able to assist LT arm to 98 degrees and lower with eccentric strength.  He is very diligent with his HEP and is anxious to return to work.  His MMT is 3-/5 due to he not able to raise his arm for full AROM.  FOTO score only improved 1 % since eval but is not indicative of his clinical improvement.  Tony Hinton will continue to recover at home with his HEP and it will take time but I am encouraged that he is able to maintain arm at 98 degrees with assitance with good control.  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  He is faithful to do his exericses and I am sure he will be compliant at home after we finalize in next 2 visits.  Tony Hinton is a delight for whom to serve.    Personal Factors and Comorbidities   Age;Comorbidity 1;Comorbidity 2;Fitness    Comorbidities  Asthma, COPD, testicular CA, arthitis    Examination-Activity Limitations  Carry;Dressing;Hygiene/Grooming;Self Feeding    Examination-Participation Restrictions  Cleaning;Laundry    Stability/Clinical Decision Making  Stable/Uncomplicated    Clinical Decision Making  Low    Rehab Potential  Good    PT Frequency  2x / week    PT Duration  12 weeks    PT Treatment/Interventions  ADLs/Self Care Home Management;Cryotherapy;Electrical Stimulation;Iontophoresis 49m/ml Dexamethasone;Moist Heat;Ultrasound;Neuromuscular re-education;Therapeutic exercise;Therapeutic activities;Patient/family education;Manual techniques;Taping;Passive range of motion    PT Next Visit Plan  continue 1-2 visits before DC to manage return to work issues    PT Home Exercise Plan  [id number], elbow extension, seated shoulder flex on table with towel sub maximal isometrics of shld, with towel into wall, flex/ext, ER and ADD, tableslide PROM for flex, abd, ER AAROM cane flex/abd  [id number] lats and stretching    Consulted and Agree with Plan of Care  Patient       Patient will benefit from skilled therapeutic intervention in order to improve the following deficits and impairments:  Postural dysfunction, Pain, Impaired UE functional use, Decreased activity tolerance, Decreased range of motion, Decreased strength  Visit Diagnosis: Stiffness of left shoulder, not elsewhere classified  Left shoulder pain, unspecified chronicity  Muscle weakness (generalized)     Problem List Patient Active Problem  List   Diagnosis Date Noted  . 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 Supratherapeutic INR 04/10/2019  . Enlarged prostate with urinary retention 09/08/2014  . Small bowel obstruction due to adhesions [facility]) 09/08/2014    Voncille Lo, PT Certified Exercise Expert for the Aging Adult  08/23/19 12:27 PM Phone:  [phone] Fax: [facility] [address] [place], [place], [address] Phone: [phone]   Fax:  [phone]  Name: Tony Hinton MRN: [id number] Date of Birth: 06/25/1941

## 2019-08-23 NOTE — Patient Instructions (Signed)
     Voncille Lo, PT Certified Exercise Expert for the Aging Adult  08/23/19 11:51 AM Phone: [phone] Fax: [phone]

## 2019-08-29 ENCOUNTER — Other Ambulatory Visit: Payer: Self-pay

## 2019-08-29 ENCOUNTER — Ambulatory Visit: Payer: Medicare HMO | Admitting: Physical Therapy

## 2019-08-29 ENCOUNTER — Encounter: Payer: Self-pay | Admitting: Physical Therapy

## 2019-08-29 DIAGNOSIS — M25612 Stiffness of left shoulder, not elsewhere classified: Secondary | ICD-10-CM | POA: Diagnosis not present

## 2019-08-29 DIAGNOSIS — M6281 Muscle weakness (generalized): Secondary | ICD-10-CM

## 2019-08-29 DIAGNOSIS — M25512 Pain in left shoulder: Secondary | ICD-10-CM

## 2019-08-29 NOTE — Therapy (Signed)
[place], [place], [address] Phone: [phone]   Fax:  [phone]  Physical Therapy Treatment  Patient Details  Name: Tony Hinton MRN: [id number] Date of Birth: 11/03/1940 Referring Provider (PT): Poggi, Marshall Cork MD Lattie Corns PA   Encounter Date: 08/29/2019  PT End of Session - 08/29/19 0859    Visit Number  20    Number of Visits  26    Date for PT Re-Evaluation  09/10/19    Authorization Type  Aetna Medicare  KX modifier after 15  visits and progress note after 29th visit    Authorization - Visit Number  18    Authorization - Number of Visits  26    PT Start Time  0800    PT Stop Time  0846    PT Time Calculation (min)  46 min    Activity Tolerance  Patient tolerated treatment well    Behavior During Therapy  [facility] for tasks assessed/performed       Past Medical History:  Diagnosis Date  . Anemia   . Arthritis   . Asthma    well controlled  . 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 COPD (chronic obstructive pulmonary disease) ([place])   . Dyspnea    chronic  . Factor 5 Leiden mutation, heterozygous ([place])   . Hypothyroidism   . Thyroid disease     Past Surgical History:  Procedure Laterality Date  . APPENDECTOMY    . COLON SURGERY  09/01/2014   sbo  . COLONOSCOPY  2015  . EYE SURGERY Bilateral    corneal transplants bil and cataracts bil  . KNEE SURGERY Right   . REVERSE SHOULDER ARTHROPLASTY Left 05/30/2019   Procedure: REVERSE SHOULDER ARTHROPLASTY;  Surgeon: Corky Mull, MD;  Location: [facility] ORS;  Service: Orthopedics;  Laterality: Left;  . SHOULDER SURGERY Left     There were no vitals filed for this visit.  Subjective Assessment - 08/29/19 5638    Subjective  Dr Roland Rack is pleased with my progress now.  I have returned to work.  He was happy with the progress.  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  No/denies    Pain Score  0-No pain    Pain Location  Shoulder    Pain Orientation  Left         [facility] PT Assessment - 08/29/19 0001      Assessment   Medical Diagnosis  --    Referring Provider (PT)  --    Onset Date/Surgical Date  --    Hand Dominance  --      Observation/Other Assessments   Focus on Therapeutic Outcomes (FOTO)   --      Posture/Postural Control   Posture Comments  kyphosis measurement tagus to wall 24.5 cm (normal and decrease fall risk 10 cm or less      AROM   Left Shoulder Flexion  84 Degrees    Left Shoulder ABduction  --    Left Shoulder Internal Rotation  --    Left Shoulder External Rotation  --  Right Elbow Flexion  --    Right Elbow Extension  --    Left Elbow Flexion  --    Left Elbow Extension  --                   [facility] Adult PT Treatment/Exercise - 08/29/19 0001      Self-Care   Self-Care  Other Self-Care Comments    Other Self-Care Comments   pt returned to work and discussed problems solving with donning work apron and Land      Knee/Hip Exercises: Standing   Other Standing Knee Exercises  with 10 lb KB in front 2 x 10 for LE strength and holding 2 x 10 fall prevention strength      Shoulder Exercises: Supine   Flexion Limitations  use of 2 lb wt 2 x 10     Other Supine Exercises  5lb wt with elbow in line iwth Nose and extend elbow assist with RT hand 3 x 10 for LT UE use    Other Supine Exercises  horizontal abd 3 x 10 with red t band      Shoulder Exercises: Seated   Other Seated Exercises  seated LT biceps 5 lb 3 x 10      Shoulder Exercises: Sidelying   Other Sidelying Exercises  sidelying LT abd 3 lb wt x3 x 10      Shoulder Exercises: Standing   Other Standing Exercises  take ball on  wall and press in to wall with LT ue x 10 flex    Other Standing Exercises  standing eccentric lowering of LT form 95 degrees flexion 15 x with RT hand       Shoulder Exercises: Stretch   Other Shoulder Stretches  in Prone elbow prop and 2 x 10 elbow press into mat             PT Education - 08/29/19 0842    Education Details  organized HEP and added additional challenge/ progression    Person(s) Educated  Patient    Methods  Explanation;Demonstration;Tactile cues;Verbal cues    Comprehension  Verbalized understanding;Returned demonstration       PT Short Term Goals - 07/12/19 1210      PT SHORT TERM GOAL #1   Title  Pt will be independent with initial HEP    Baseline  basic PROM and resisted elbow and wrist beginning AAROM    Time  4    Period  Weeks    Status  Achieved    Target Date  07/16/19      PT SHORT TERM GOAL #2   Title  Pt will be able to achieve 90 degrees flex/abd with AAROM    Baseline  PROM flex 121/abd 92, IR 60, ER 31    Time  4    Period  Weeks    Status  Achieved    Target Date  07/16/19      PT SHORT TERM GOAL #3   Title  Pt will be able to verbalize precautians for rTSR    Baseline  Pt able to verbalize precautians and need for good posture when exericising to protect joint    Time  4    Period  Weeks    Status  Achieved    Target Date  07/16/19        PT Long Term Goals - 08/23/19 1136      PT LONG TERM GOAL #1   Title  Pt will be independent with advanced HEP    Baseline  Pt doing advanced exercises for strength and AROM,  Pt with 3-/5 of LT shld due not full range of shoulder    Time  12    Period  Weeks    Status  On-going    Target Date  09/10/19      PT LONG TERM GOAL #2   Title  left shoulder AROM scaption will improve to 0-115 degrees for improved overhead reaching.    Baseline  PROM flex 137/ abd 107; AROM flex 82/abd 79  Eccentrically can hold at 95 degrees flexion with assistance from RT hand    Time  12    Period  Weeks     Status  Partially Met    Target Date  09/10/19      PT LONG TERM GOAL #3   Title  FOTO will improve from 47% limitation   to 32 % limitation     indicating improved functional mobility.    Baseline  eval limiation 47%  08-23-19 now 46%  only 1% change - not indicative of progress madeclinically    Time  12    Period  Weeks    Status  On-going    Target Date  09/10/19      PT LONG TERM GOAL #4   Title  Tolerate light resistance exercises in flexion and scaption with minimal pain    Baseline  Pt doing AROM in sidelying and standing  3-/5 MMT(not able to do full AROM) given red t band/yellow t band pt limited with kyphotic posture. 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 will have 2 additional appt to problems solve issues at work if any present itself    Time  12    Period  Weeks    Status  Unable to assess    Target Date  09/10/19            Plan - 08/29/19 1310    Clinical Impression Statement  Pt returns to cliniic.  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  He is to bring his notebook of exericises to final visit next week before DC.  Pt kyphosis test (tagus to wall measurement is 24 cm and normal is 10 cm) pt has increased risk of fall due to kyphotic posture.  Spent time education on LE strength with UE to encourage falls prevention and ease of raising L UE with better posture.  Will continue to work next week to finalize HEP on last visit.    Personal Factors and  Comorbidities  Age;Comorbidity 1;Comorbidity 2;Fitness    Comorbidities  Asthma, COPD, testicular CA, arthitis    Examination-Activity Limitations  Carry;Dressing;Hygiene/Grooming;Self Feeding  Examination-Participation Restrictions  Cleaning;Laundry    Stability/Clinical Decision Making  Stable/Uncomplicated    Clinical Decision Making  Low    Rehab Potential  Good    PT Frequency  2x / week    PT Duration  12 weeks    PT Treatment/Interventions  ADLs/Self Care Home Management;Cryotherapy;Electrical Stimulation;Iontophoresis 47m/ml Dexamethasone;Moist Heat;Ultrasound;Neuromuscular re-education;Therapeutic exercise;Therapeutic activities;Patient/family education;Manual techniques;Taping;Passive range of motion    PT Next Visit Plan  Dc next visit  FOTO and GOals and HEP    PT Home Exercise Plan  [id number]  [id number] pendulum, elbow extension, seated shoulder flex on table with towel sub maximal isometrics of shld, with towel into wall, flex/ext, ER and ADD, tableslide PROM for flex, abd, ER AAROM cane flex/abd  [id number] lats and stretching    Consulted and Agree with Plan of Care  Patient       Patient will benefit from skilled therapeutic intervention in order to improve the following deficits and impairments:  Postural dysfunction, Pain, Impaired UE functional use, Decreased activity tolerance, Decreased range of motion, Decreased strength  Visit Diagnosis: Stiffness of left shoulder, not elsewhere classified  Left shoulder pain, unspecified chronicity  Muscle weakness (generalized)     Problem List Patient Active Problem List   Diagnosis Date Noted  . 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 Supratherapeutic INR 04/10/2019  . Enlarged prostate with urinary retention 09/08/2014  . Small bowel obstruction due to adhesions ([facility] 09/08/2014   LVoncille Lo PT Certified Exercise Expert for the Aging Adult  08/29/19 1:14 PM Phone: [phone]:  [facility] [address]Phone: [phone]  Fax:  [phone] Name: JCASSANDRA MCMANAMANMRN: [id number] of Birth: 7Oct 16, 1942

## 2019-08-29 NOTE — Patient Instructions (Signed)
ROUTINE for Home use  ALways good to be in front of mirror to see good posture with movement  Begin with squats at sink, try to sit in chair with hips higher than knees and stand with 8lb milk jug in front to work on legs for strength and fall prevention to protect shoulders  Supine (on your back)  1) 2lb weight and flex above head toward wall  3 x 10 2) 5lb weight with elbow in line with nose and extend elbow, assist with RT hand 3) Use red theraband and  Do bring band with both hands across chest 3 x 10.  Keep at chest height   PRONE  On your stomach face down.  Elbow prop on stomach and press elbows into mat/ bed and round out upper back    STANDING Take ball and roll up wall with LT UE  3 x 10. Please keep elbow straight  Sit with good posture and take 5lb wt and flex elbow and straighten 3 x 10  SIDELYING    Tony Hinton, PT Certified Exercise Expert for the Aging Adult  08/29/19 8:40 AM Phone: [phone] Fax: [phone]

## 2019-09-05 ENCOUNTER — Other Ambulatory Visit: Payer: Self-pay

## 2019-09-05 ENCOUNTER — Ambulatory Visit: Payer: Medicare HMO | Admitting: Physical Therapy

## 2019-09-05 DIAGNOSIS — M25612 Stiffness of left shoulder, not elsewhere classified: Secondary | ICD-10-CM

## 2019-09-05 DIAGNOSIS — M25512 Pain in left shoulder: Secondary | ICD-10-CM

## 2019-09-05 DIAGNOSIS — M6281 Muscle weakness (generalized): Secondary | ICD-10-CM

## 2019-09-05 NOTE — Patient Instructions (Signed)
               Voncille Lo, PT Certified Exercise Expert for the Aging Adult  09/05/19 8:28 AM Phone: [phone] Fax: [phone]

## 2019-09-05 NOTE — Therapy (Signed)
[place], [place], [address] Phone: [phone]   Fax:  [phone]  Physical Therapy Treatment/Discharge Note  Patient Details  Name: Tony Hinton MRN: [id number] Date of Birth: Jan 21, 1941 Referring Provider (PT): Poggi, Marshall Cork MD Lattie Corns PA   Encounter Date: 09/05/2019  PT End of Session - 09/05/19 0832    Visit Number  21    Number of Visits  26    Date for PT Re-Evaluation  09/10/19    Authorization Type  Aetna Medicare  KX modifier after 15  visits and progress note after 29th visit    Authorization - Visit Number  21    Authorization - Number of Visits  26    PT Start Time  0802    PT Stop Time  0846    PT Time Calculation (min)  44 min    Activity Tolerance  Patient tolerated treatment well    Behavior During Therapy  [facility] for tasks assessed/performed       Past Medical History:  Diagnosis Date  . Anemia   . Arthritis   . Asthma    well controlled  . 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 COPD (chronic obstructive pulmonary disease) ([place])   . Dyspnea    chronic  . Factor 5 Leiden mutation, heterozygous ([place])   . Hypothyroidism   . Thyroid disease     Past Surgical History:  Procedure Laterality Date  . APPENDECTOMY    . COLON SURGERY  09/01/2014   sbo  . COLONOSCOPY  2015  . EYE SURGERY Bilateral    corneal transplants bil and cataracts bil  . KNEE SURGERY Right   . REVERSE SHOULDER ARTHROPLASTY Left 05/30/2019   Procedure: REVERSE SHOULDER ARTHROPLASTY;  Surgeon: Corky Mull, MD;  Location: [facility] ORS;  Service: Orthopedics;  Laterality: Left;  . SHOULDER SURGERY Left     There were no vitals filed for this visit.  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  He is very compliant with exericises  and wants to make himself stronger and  better.  Pt did enter with LT swollen knee as only complaint.  He has 0/10 pain in LT shoulder. Because he cannot lift arm above 90 degrees his MMT for shoulder is 3-/5 strength.    Pertinent History  Asthma, COPD, testicular CA, arthitis    Limitations  Lifting;House hold activities;Other (comment)    Currently in Pain?  No/denies    Pain Score  0-No pain    Pain Location  Shoulder    Pain Orientation  Left    Pain Onset  More than a month ago    Pain Frequency  Rarely         [facility] PT Assessment - 09/05/19 0001      Assessment   Medical Diagnosis  Reverse Total Shoulder Left    Referring Provider (PT)  Poggi, Marshall Cork MD Lattie Corns PA    Onset Date/Surgical Date  05/30/19    Hand Dominance  Left      Observation/Other Assessments   Focus on Therapeutic Outcomes (FOTO)   Intake 62% limtaiton 38% predicted 32%      AROM   Left Shoulder Flexion  85 Degrees    Left Shoulder ABduction  80 Degrees  Left Shoulder Internal Rotation  59 Degrees   to waist, able to put on belt   Left Shoulder External Rotation  50 Degrees   to C6   Right Elbow Flexion  153    Right Elbow Extension  0    Left Elbow Flexion  150   using 5 lb wt   Left Elbow Extension  9      PROM   Right Shoulder Flexion  147 Degrees    Left Shoulder Flexion  137 Degrees   max   Left Shoulder ABduction  110 Degrees    Left Shoulder Internal Rotation  65 Degrees    Left Shoulder External Rotation  55 Degrees      Strength   Overall Strength Comments  Pt able to eccentrically lower from 95 degrees but unable to raise above 90 degrees concentrically    Left Shoulder Flexion  3-/5    Left Shoulder Extension  3-/5    Left Shoulder ABduction  3-/5    Left Shoulder External Rotation  3-/5                   [facility] Adult PT Treatment/Exercise - 09/05/19 0001      Posture/Postural Control   Posture Comments  kyphosis measurement tagus to wall 24.5 cm (normal and decrease fall risk 10 cm or less       Self-Care   Self-Care  Other Self-Care Comments    Other Self-Care Comments   Discussed how to progress HEP and  paired down HEP for daily and weekly assignments,  discussed work/household chore strategies for using LT arm      Therapeutic Activites    Therapeutic Activities  Other Therapeutic Activities    Other Therapeutic Activities  standing to floor transfer and fall prevention education       Shoulder Exercises: Supine   Other Supine Exercises  5lb wt with elbow in line iwth Nose and extend elbow assist with RT hand 3 x 10 for LT UE use    Other Supine Exercises  horizontal abd 3 x 10 with red t band      Shoulder Exercises: Seated   Other Seated Exercises  seated LT biceps 5 lb 3 x 10      Shoulder Exercises: Sidelying   Other Sidelying Exercises  sidelying LT abd 3 lb wt x3 x 10      Shoulder Exercises: Standing   Diagonals Limitations  UE wall slides alternating bil     Other Standing Exercises  standing with red t band resisted flexion, bil, scapution bil abduction bil and Bil ER 10 x each    added to HEP   Other Standing Exercises  standing towards wall and eccentric lowering of arm ffrom walll, standiing cane AAROM with dowel, flexion, scaption and abduction 10 x 2 each.  ER 10 x 2      Shoulder Exercises: Stretch   Other Shoulder Stretches  in Prone elbow prop and 2 x 10 elbow press into mat             PT Education - 09/05/19 0831    Education Details  streamlined HEP to daily , weekly and 2x a week exercises.   Discussed ongoing need for flexibility, and strengthening    Person(s) Educated  Patient    Methods  Explanation;Demonstration;Handout;Tactile cues;Verbal cues    Comprehension  Verbalized understanding;Returned demonstration       PT Short Term Goals - 07/12/19 1210  PT SHORT TERM GOAL #1   Title  Pt will be independent with initial HEP    Baseline  basic PROM and resisted elbow and wrist beginning AAROM    Time  4    Period  Weeks     Status  Achieved    Target Date  07/16/19      PT SHORT TERM GOAL #2   Title  Pt will be able to achieve 90 degrees flex/abd with AAROM    Baseline  PROM flex 121/abd 92, IR 60, ER 31    Time  4    Period  Weeks    Status  Achieved    Target Date  07/16/19      PT SHORT TERM GOAL #3   Title  Pt will be able to verbalize precautians for rTSR    Baseline  Pt able to verbalize precautians and need for good posture when exericising to protect joint    Time  4    Period  Weeks    Status  Achieved    Target Date  07/16/19        PT Long Term Goals - 09/05/19 0847      PT LONG TERM GOAL #1   Title  Pt will be independent with advanced HEP    Baseline  Pt doing advanced exercises for strength and AROM,  Pt with 3-/5 of LT shld due not full range of shoulder just 85 degrees AROM flexion LT    Time  12    Period  Weeks    Status  Achieved    Target Date  09/10/19      PT LONG TERM GOAL #2   Title  left shoulder AROM scaption will improve to 0-115 degrees for improved overhead reaching.    Baseline  PROM flex 137/ abd 107; AROM flex 82/abd 79  Eccentrically can hold at 95 degrees flexion with assistance from RT hand    Time  12    Period  Weeks    Status  Partially Met      PT LONG TERM GOAL #3   Title  FOTO will improve from 47% limitation   to 32 % limitation     indicating improved functional mobility.    Baseline  limitation is 38% 09-05-19  from 47% eval    Time  12    Period  Weeks    Status  Partially Met      PT LONG TERM GOAL #4   Title  Tolerate light resistance exercises in flexion and scaption with minimal pain    Baseline  Pt doing AROM in sidelying and standing  3-/5 MMT(not able to do full AROM)  pt can eccentrically hold at 95 degrees but concentrically to 85 degrees    Time  12    Period  Weeks    Status  Partially Met      PT LONG TERM GOAL #5   Title  Mr.Schult zwill be able to return to driving and manage steering wheel safely without exacerbating  pain,    Baseline  able to drive self    Time  12    Period  Weeks    Status  Achieved      PT LONG TERM GOAL #6   Title  Pt will be able to use arms to operate  machinery safely as a Furniture conservator/restorer in order to return to his work    Baseline  Pt is able to operate machinery  and has been working since August 27, 2019    Time  12    Period  Weeks    Status  Achieved    Target Date  09/10/19            Plan - 09/05/19 1215    Clinical Impression Statement  Pt returns for last visit /DC.  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  Pt is very compliant and he has diligently applied himself to rehab.  Pt LT shld. AROM/PROM flex 85/ 137, abd 80/110, IR 59/65, ER50/55,  MMT 3-/5 LT shld flex/abd/ IR/ER, . Pt has 24 cm tagus to wall meausrement for kyphosis ( normal 10 cm)  Pt has tendency to lean forward and has been educated on fall precautian and need to stand with better posture.  Mr Puthoff has information/education In HEP. Pt originally came to clinic with cane but no longer utilzes AD. Pt also demonstratied ability to descend and rise from floor for safety.  Pt is ready for DC to with HEP    Personal Factors and Comorbidities  Age;Comorbidity 1;Comorbidity 2;Fitness    Comorbidities  Asthma, COPD, testicular CA, arthitis    Examination-Activity Limitations  Carry;Dressing;Hygiene/Grooming;Self Feeding    Examination-Participation Restrictions  Cleaning;Laundry       Patient will benefit from skilled therapeutic intervention in order to improve the following deficits and impairments:     Visit Diagnosis: Stiffness of left shoulder, not elsewhere classified  Left shoulder pain, unspecified chronicity  Muscle weakness (generalized)     Problem List Patient Active Problem List   Diagnosis Date Noted  . 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 Supratherapeutic INR 04/10/2019  . Enlarged prostate with urinary retention 09/08/2014  . Small  bowel obstruction due to adhesions [facility]) 09/08/2014   Voncille Lo, PT Certified Exercise Expert for the Aging Adult  09/05/19 12:25 PM Phone: [phone] Fax: [facility] [address] [place], [place], [address] Phone: [phone]   Fax:  [phone]    PHYSICAL THERAPY DISCHARGE SUMMARY  Visits from Start of Care: 21  Current functional level related to goals / functional outcomes: As above   Remaining deficits: LT AROM flex 85 degrees.  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  Patient goals were partially met. Patient is being discharged due to being pleased with the current functional level.  ?????    And is independent with HEP and progression at home Name: Tony Hinton MRN: [id number] Date of Birth: Sep 07, 1941

## 2019-11-17 ENCOUNTER — Ambulatory Visit: Payer: Medicare HMO | Attending: Internal Medicine

## 2019-11-17 DIAGNOSIS — Z23 Encounter for immunization: Secondary | ICD-10-CM | POA: Insufficient documentation

## 2019-11-17 NOTE — Progress Notes (Signed)
   Covid-19 Vaccination Clinic  Name:  Tony Hinton    MRN: [id number] DOB: May 09, 1941  11/17/2019  [name] was observed post Covid-19 immunization for 15 minutes without incidence. He was provided with Vaccine Information Sheet and instruction to access the V-Safe system.   [name] was instructed to call 911 with any severe reactions post vaccine: Marland Kitchen Difficulty breathing  . Swelling of your face and throat  . A fast heartbeat  . A bad rash all over your body  . Dizziness and weakness    Immunizations Administered    Name Date Dose VIS Date Route   Pfizer COVID-19 Vaccine 11/17/2019  9:44 AM 0.3 mL 08/30/2019 Intramuscular   Manufacturer: [place]   Lot: [id number]   [place]: [id number]

## 2019-12-17 ENCOUNTER — Ambulatory Visit: Payer: Medicare HMO | Attending: Internal Medicine

## 2019-12-17 DIAGNOSIS — Z23 Encounter for immunization: Secondary | ICD-10-CM

## 2019-12-17 NOTE — Progress Notes (Signed)
   Covid-19 Vaccination Clinic  Name:  [name]    MRN: [id number] DOB: [birth year]  12/17/2019  Mr. Kaufer was observed post Covid-19 immunization for 15 minutes without incident. He was provided with Vaccine Information Sheet and instruction to access the V-Safe system.   Mr. Butorac was instructed to call 911 with any severe reactions post vaccine: Marland Kitchen Difficulty breathing  . Swelling of face and throat  . A fast heartbeat  . A bad rash all over body  . Dizziness and weakness   Immunizations Administered    Name Date Dose VIS Date Route   Pfizer COVID-19 Vaccine 12/17/2019 12:58 PM 0.3 mL 08/30/2019 Intramuscular   Manufacturer: [place]   Lot: [id number]   [place]: [id number]

## 2020-02-16 ENCOUNTER — Emergency Department (HOSPITAL_COMMUNITY): Payer: Medicare HMO

## 2020-02-16 ENCOUNTER — Observation Stay (HOSPITAL_COMMUNITY)
Admission: EM | Admit: 2020-02-16 | Discharge: 2020-02-17 | Disposition: A | Discharge status: Home / Self Care | Payer: Medicare HMO | Attending: Family Medicine | Admitting: Family Medicine

## 2020-02-16 ENCOUNTER — Encounter (HOSPITAL_COMMUNITY): Payer: Self-pay | Admitting: Emergency Medicine

## 2020-02-16 DIAGNOSIS — E039 Hypothyroidism, unspecified: Secondary | ICD-10-CM | POA: Diagnosis not present

## 2020-02-16 DIAGNOSIS — Z7901 Long term (current) use of anticoagulants: Secondary | ICD-10-CM | POA: Insufficient documentation

## 2020-02-16 DIAGNOSIS — J449 Chronic obstructive pulmonary disease, unspecified: Secondary | ICD-10-CM | POA: Insufficient documentation

## 2020-02-16 DIAGNOSIS — Z79890 Hormone replacement therapy (postmenopausal): Secondary | ICD-10-CM | POA: Diagnosis not present

## 2020-02-16 DIAGNOSIS — D6851 Activated protein C resistance: Secondary | ICD-10-CM | POA: Diagnosis not present

## 2020-02-16 DIAGNOSIS — M1990 Unspecified osteoarthritis, unspecified site: Secondary | ICD-10-CM | POA: Diagnosis not present

## 2020-02-16 DIAGNOSIS — Z79899 Other long term (current) drug therapy: Secondary | ICD-10-CM | POA: Insufficient documentation

## 2020-02-16 DIAGNOSIS — Z20822 Contact with and (suspected) exposure to covid-19: Secondary | ICD-10-CM | POA: Diagnosis not present

## 2020-02-16 DIAGNOSIS — R4182 Altered mental status, unspecified: Secondary | ICD-10-CM | POA: Diagnosis present

## 2020-02-16 DIAGNOSIS — Z8547 Personal history of malignant neoplasm of testis: Secondary | ICD-10-CM | POA: Diagnosis not present

## 2020-02-16 DIAGNOSIS — R569 Unspecified convulsions: Principal | ICD-10-CM

## 2020-02-16 LAB — HEPATIC FUNCTION PANEL
ALT: 19 U/L (ref 0–44)
AST: 29 U/L (ref 15–41)
Albumin: 3.4 g/dL — ABNORMAL LOW (ref 3.5–5.0)
Alkaline Phosphatase: 74 U/L (ref 38–126)
Bilirubin, Direct: <0.1 mg/dL (ref 0.0–0.2)
Total Bilirubin: 0.5 mg/dL (ref 0.3–1.2)
Total Protein: 6.6 g/dL (ref 6.5–8.1)

## 2020-02-16 LAB — CBC
HCT: 40.9 % (ref 39.0–52.0)
Hemoglobin: 13 g/dL (ref 13.0–17.0)
MCH: 30.5 pg (ref 26.0–34.0)
MCHC: 31.8 g/dL (ref 30.0–36.0)
MCV: 96 fL (ref 80.0–100.0)
Platelets: 168 10*3/uL (ref 150–400)
RBC: 4.26 MIL/uL (ref 4.22–5.81)
RDW: 13.5 % (ref 11.5–15.5)
WBC: 4.5 10*3/uL (ref 4.0–10.5)
nRBC: 0 % (ref 0.0–0.2)

## 2020-02-16 LAB — URINALYSIS, ROUTINE W REFLEX MICROSCOPIC
Bilirubin Urine: NEGATIVE
Glucose, UA: NEGATIVE mg/dL
Hgb urine dipstick: NEGATIVE
Ketones, ur: NEGATIVE mg/dL
Leukocytes,Ua: NEGATIVE
Nitrite: NEGATIVE
Protein, ur: NEGATIVE mg/dL
Specific Gravity, Urine: 1.019 (ref 1.005–1.030)
pH: 5 (ref 5.0–8.0)

## 2020-02-16 LAB — BASIC METABOLIC PANEL
Anion gap: 13 (ref 5–15)
BUN: 18 mg/dL (ref 8–23)
CO2: 27 mmol/L (ref 22–32)
Calcium: 9.7 mg/dL (ref 8.9–10.3)
Chloride: 105 mmol/L (ref 98–111)
Creatinine, Ser: 0.95 mg/dL (ref 0.61–1.24)
GFR calc Af Amer: >60 mL/min (ref >60)
GFR calc non Af Amer: >60 mL/min (ref >60)
Glucose, Bld: 116 mg/dL — ABNORMAL HIGH (ref 70–99)
Potassium: 4.1 mmol/L (ref 3.5–5.1)
Sodium: 145 mmol/L (ref 135–145)

## 2020-02-16 LAB — BRAIN NATRIURETIC PEPTIDE: B Natriuretic Peptide: 155.8 pg/mL — ABNORMAL HIGH (ref 0.0–100.0)

## 2020-02-16 LAB — PHOSPHORUS: Phosphorus: 2.5 mg/dL (ref 2.5–4.6)

## 2020-02-16 LAB — CBG MONITORING, ED: Glucose-Capillary: 92 mg/dL (ref 70–99)

## 2020-02-16 LAB — SARS CORONAVIRUS 2 BY RT PCR (HOSPITAL ORDER, PERFORMED IN ~~LOC~~ HOSPITAL LAB): SARS Coronavirus 2: NEGATIVE

## 2020-02-16 LAB — PROTIME-INR
INR: 2.4 — ABNORMAL HIGH (ref 0.8–1.2)
Prothrombin Time: 25 seconds — ABNORMAL HIGH (ref 11.4–15.2)

## 2020-02-16 LAB — CK: Total CK: 339 U/L (ref 49–397)

## 2020-02-16 LAB — MAGNESIUM: Magnesium: 1.9 mg/dL (ref 1.7–2.4)

## 2020-02-16 MED ORDER — WARFARIN SODIUM 2 MG PO TABS
2.0000 mg | ORAL_TABLET | Freq: Every day | ORAL | Status: DC
  Filled 2020-02-16: qty 1

## 2020-02-16 MED ORDER — LEVOTHYROXINE SODIUM 75 MCG PO TABS
75.0000 ug | ORAL_TABLET | Freq: Every day | ORAL | Status: DC
  Administered 2020-02-17: 75 ug via ORAL
  Filled 2020-02-16: qty 1

## 2020-02-16 MED ORDER — ENOXAPARIN SODIUM 40 MG/0.4ML ~~LOC~~ SOLN
40.0000 mg | SUBCUTANEOUS | Status: DC
Start: 2020-02-16 — End: 2020-02-16

## 2020-02-16 MED ORDER — ALBUTEROL SULFATE HFA 108 (90 BASE) MCG/ACT IN AERS
2.0000 | INHALATION_SPRAY | Freq: Once | RESPIRATORY_TRACT | Status: AC
  Administered 2020-02-16: 2 via RESPIRATORY_TRACT
  Filled 2020-02-16: qty 6.7

## 2020-02-16 MED ORDER — LACTATED RINGERS IV SOLN: INTRAVENOUS | Status: AC

## 2020-02-16 MED ORDER — WARFARIN SODIUM 4 MG PO TABS
8.0000 mg | ORAL_TABLET | Freq: Every day | ORAL | Status: DC
  Administered 2020-02-16: 8 mg via ORAL
  Filled 2020-02-16 (×2): qty 2

## 2020-02-16 MED ORDER — WARFARIN - PHYSICIAN DOSING INPATIENT: Freq: Every day | Status: DC

## 2020-02-16 MED ORDER — WARFARIN SODIUM 4 MG PO TABS: 8.0000 mg | ORAL_TABLET | Freq: Every day | ORAL | Status: DC

## 2020-02-16 MED ORDER — WARFARIN SODIUM 5 MG PO TABS: 5.0000 mg | ORAL_TABLET | Freq: Every day | ORAL | Status: DC

## 2020-02-16 MED ORDER — WARFARIN SODIUM 7.5 MG PO TABS: 8.0000 mg | ORAL_TABLET | Freq: Every day | ORAL | Status: DC
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
## 2020-02-16 NOTE — ED Provider Notes (Signed)
Patient received at signout from [facility].  Please see his note for full HPI.  In short, 79 year old male with a history of hypothyroidism, COPD, factor V Leiden, thyroid disease, cancer presents to the ER for concern for seizure activity.  This morning around 7 AM, the patient's son heard him yell out while he was sleeping in bed, when he walked into the room, the patient had his arms up in a clenched position with generalized shaking for approximately 30 seconds.  The patient's son tried to arouse the patient, and after 30 seconds, he stopped shaking but was clearly confused, combative and unaware of his surroundings for approximately the next 10 to 15 minutes.  He then slowly regained awareness of his surroundings.  Patient has been having some episodes of incontinence prior to this episode, but he did have an episode of incontinence during the event as well.  There was no foaming at the mouth.  He does not have a history of seizures.  Denies any fevers, chills, dysuria, abdominal pain, cough.  No alcohol or drug use.  He is on Coumadin for factor V Leiden.  Physical exam without neuro deficits.  Previous provider ordered basic lab work, which were without acute abnormalities.  His PT/INR seems to be appropriate for his anticoagulation status.  CT without evidence of bleed. Dr. Lawson Fiscal was consulted, and an MRI was suggested.  If this was negative, neurology states that he would be stable to follow-up in their office without starting any medications.  On further discussion with Dr. Ayesha Rumpf who previously saw the patient as well, there is still concern for dysrhythmia as a source of his episode.  She recommends bringing the patient for cardiac monitoring/observation.  I discussed this with the patient, who is amenable to this plan.  Pending hospital consult for admission.  5:54 PM: Spoke with Dr. Rebecka Apley with [facility], she will come see and evaluate the patient for admission.  The patient has  remained hemodynamically stable with no acute neurologic changes throughout the ED stay.    Physical Exam  BP (!) 140/91   Pulse 61   Temp 97.9 F (36.6 C) (Oral)   Resp 15   Ht 5\' 8"  (1.727 m)   Wt 81.6 kg   SpO2 98%   BMI 27.37 kg/m   Physical Exam Vitals and nursing note reviewed.  Constitutional:      General: He is not in acute distress.    Appearance: Normal appearance. He is well-developed. He is not ill-appearing or toxic-appearing.  HENT:     Head: Normocephalic and atraumatic.  Eyes:     Conjunctiva/sclera: Conjunctivae normal.  Cardiovascular:     Rate and Rhythm: Normal rate and regular rhythm.     Heart sounds: No murmur.  Pulmonary:     Effort: Pulmonary effort is normal. No respiratory distress.     Breath sounds: Normal breath sounds.  Abdominal:     General: Abdomen is flat.     Palpations: Abdomen is soft.     Tenderness: There is no abdominal tenderness.  Musculoskeletal:        General: No swelling or tenderness. Normal range of motion.     Cervical back: Normal range of motion and neck supple.     Right lower leg: No edema.     Left lower leg: No edema.  Skin:    General: Skin is warm and dry.     Capillary Refill: Capillary refill takes less than 2 seconds.  Findings: No erythema.  Neurological:     General: No focal deficit present.     Mental Status: He is alert and oriented to person, place, and time.     Sensory: No sensory deficit.     Motor: No weakness.     Comments: Mental Status:  Alert, thought content appropriate, able to give a coherent history. Speech fluent without evidence of aphasia. Able to follow 2 step commands without difficulty.  Cranial Nerves:  II: Peripheral visual fields grossly normal, pupils equal, round, reactive to light III,IV, VI: ptosis not present, extra-ocular motions intact bilaterally  V,VII: smile symmetric, facial light touch sensation equal VIII: hearing grossly normal to voice  X: uvula elevates  symmetrically  XI: bilateral shoulder shrug symmetric and strong XII: midline tongue extension without fassiculations Motor:  Normal tone. 5/5 strength of BUE and BLE major muscle groups including strong and equal grip strength and dorsiflexion/plantar flexion Sensory: light touch normal in all extremities. Cerebellar: normal finger-to-nose with bilateral upper extremities, Romberg sign absent Gait: not accessed  Alert to person place and time  Psychiatric:        Mood and Affect: Mood normal.        Behavior: Behavior normal.     ED Course/Procedures     Procedures  MDM        Garald Balding, PA-C 02/16/20 Noralee Space, MD 02/17/20 [id number]

## 2020-02-16 NOTE — ED Provider Notes (Signed)
[facility] EMERGENCY DEPARTMENT Provider Note   CSN: [id number] Arrival date & time: 02/16/20  0746     History Chief Complaint  Patient presents with  . Seizures    Tony Hinton is a 79 y.o. male.  HPI HPI Comments: Tony Hinton is a 79 y.o. male with history of anemia, testicular cancer, COPD, factor V Leiden who presents to the Emergency Department complaining of altered mental status.  Per patient's wife, patient was asleep this morning and his son was in the room, his son states that he flexed both arms and jerked for an unspecified amount of time.  He was screaming when this occurred.  His son noted to his mother that he had urinary incontinence.  Patient has been experiencing intermittent incontinence for the last month or 2.  They are unsure whether the incontinence occurred during this episode.  After the event patient was not clearly answering questions.  His wife states that he sometimes was slurring words but otherwise was speaking clearly but just could not answer basic questions about who he was or where he was.  This lasted for about 10 to 15 minutes.  EMS brought him to the emergency department.  His wife states he is now at baseline.  He takes Coumadin due to his factor V Leiden.  Patient has no physical complaints at this time.     Past Medical History:  Diagnosis Date  . Anemia   . Arthritis   . Asthma    well controlled  . Cancer ([place]) 2000   testicular   . 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 COPD (chronic obstructive pulmonary disease) ([place])   . Dyspnea    chronic  . Factor 5 Leiden mutation, heterozygous ([place])   . Hypothyroidism   . Thyroid disease     Patient Active Problem List   Diagnosis Date Noted  . 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 Supratherapeutic INR 04/10/2019  . Enlarged prostate with urinary retention 09/08/2014  . Small bowel obstruction due to adhesions ([place]) 09/08/2014    Past Surgical  History:  Procedure Laterality Date  . APPENDECTOMY    . COLON SURGERY  09/01/2014   sbo  . COLONOSCOPY  2015  . EYE SURGERY Bilateral    corneal transplants bil and cataracts bil  . KNEE SURGERY Right   . REVERSE SHOULDER ARTHROPLASTY Left 05/30/2019   Procedure: REVERSE SHOULDER ARTHROPLASTY;  Surgeon: Corky Mull, MD;  Location: [facility] ORS;  Service: Orthopedics;  Laterality: Left;  . SHOULDER SURGERY Left       No family history on file.  Social History   Tobacco Use  . Smoking status: Never Smoker  . Smokeless tobacco: Never Used  Substance Use Topics  . Alcohol use: Yes    Alcohol/week: 0.0 standard drinks    Comment: beer occ  . Drug use: No    Home Medications Prior to Admission medications   Medication Sig Start Date End Date Taking? Authorizing Provider  allopurinol (ZYLOPRIM) 100 MG tablet Take 100 mg by mouth every morning.  09/05/14   [provider]  carboxymethylcellulose (REFRESH PLUS) 0.5 % SOLN Place 1 drop into both eyes 2 (two) times daily as needed (dry eyes).    [provider]  cholecalciferol (VITAMIN D3) 25 MCG (1000 UT) tablet Take 1,000 Units by mouth daily.    [provider]  desonide (DESOWEN) 0.05 % cream Apply 1 application topically 2 (two) times  daily as needed (irritation).    [provider]  enoxaparin (LOVENOX) 40 MG/0.4ML injection Inject 0.4 mLs (40 mg total) into the skin daily. 06/01/19   Lattie Corns, PA-C  folic acid (FOLVITE) 1 MG tablet Take 1 mg by mouth daily.  07/24/14   [provider]  levothyroxine (SYNTHROID) 75 MCG tablet Take 75 mcg by mouth daily before breakfast. Take on an empty stomach with a glass of water at least 30-60 minutes before breakfast 05/14/18 05/21/19  [provider]  oxyCODONE (OXY IR/ROXICODONE) 5 MG immediate release tablet Take 1-2 tablets (5-10 mg total) by mouth every 4 (four) hours as needed for moderate pain. 05/31/19   Lattie Corns, PA-C   Potassium 99 MG TABS Take 99 mg by mouth daily.    [provider]  PROVENTIL HFA 108 (90 BASE) MCG/ACT inhaler Inhale 1 puff into the lungs every 4 (four) hours as needed for wheezing or shortness of breath.  07/25/14   [provider]  traMADol (ULTRAM) 50 MG tablet Take 1 tablet (50 mg total) by mouth every 6 (six) hours as needed for moderate pain. 05/31/19   Lattie Corns, PA-C  triamcinolone cream (KENALOG) 0.1 % Apply 1 application topically 2 (two) times daily as needed (rash).    [provider]  vitamin B-12 (CYANOCOBALAMIN) 1000 MCG tablet Take 1,000 mcg by mouth daily.    [provider]  warfarin (COUMADIN) 1 MG tablet Take 2 mg by mouth daily.    [provider]  warfarin (COUMADIN) 4 MG tablet Take 2 tablets (8 mg total) by mouth one time only at 6 PM. 04/12/19   Bettey Costa, MD  warfarin (COUMADIN) 5 MG tablet Take 5 mg by mouth daily.    [provider]    Allergies    Penicillins, Sulfa antibiotics, and Shellfish allergy  Review of Systems   Review of Systems  All other systems reviewed and are negative. Ten systems reviewed and are negative for acute change, except as noted in the HPI.   Physical Exam Updated Vital Signs BP (!) 166/95 (BP Location: Right Arm)   Pulse 80   Temp 97.9 F (36.6 C) (Oral)   Resp 16   Ht 5\' 8"  (1.727 m)   Wt 81.6 kg   SpO2 95%   BMI 27.37 kg/m   Physical Exam Vitals and nursing note reviewed.  Constitutional:      General: He is not in acute distress.    Appearance: Normal appearance. He is not ill-appearing, toxic-appearing or diaphoretic.  HENT:     Head: Normocephalic and atraumatic.     Right Ear: External ear normal.     Left Ear: External ear normal.     Nose: Nose normal.     Mouth/Throat:     Mouth: Mucous membranes are moist.     Pharynx: Oropharynx is clear. No oropharyngeal exudate or posterior oropharyngeal erythema.  Eyes:     Extraocular Movements:  Extraocular movements intact.  Cardiovascular:     Rate and Rhythm: Normal rate and regular rhythm.     Pulses: Normal pulses.     Heart sounds: Normal heart sounds. No murmur. No friction rub. No gallop.   Pulmonary:     Effort: Pulmonary effort is normal. No respiratory distress.     Breath sounds: Normal breath sounds. No stridor. No wheezing, rhonchi or rales.  Abdominal:     General: Abdomen is flat.     Tenderness: There is  no abdominal tenderness.  Musculoskeletal:        General: Normal range of motion.     Cervical back: Normal range of motion and neck supple. No tenderness.  Skin:    General: Skin is warm and dry.  Neurological:     General: No focal deficit present.     Mental Status: He is alert and oriented to person, place, and time.     Comments: Patient is oriented to person, place, time.  He answers questions clearly and appropriately.  He phonates in complete sentences.  Finger-to-nose intact bilaterally with no visible dysmetria.  Negative pronator drift.  Strength is 5 out of 5 in the bilateral upper and lower extremities.  Distal sensation intact in all extremities.  Extraocular movements intact.  No facial droop.  Psychiatric:        Mood and Affect: Mood normal.        Behavior: Behavior normal.    ED Results / Procedures / Treatments   Labs (all labs ordered are listed, but only abnormal results are displayed) Labs Reviewed  BASIC METABOLIC PANEL - Abnormal; Notable for the following components:      Result Value   Glucose, Bld 116 (*)    All other components within normal limits  PROTIME-INR - Abnormal; Notable for the following components:   Prothrombin Time 25.0 (*)    INR 2.4 (*)    All other components within normal limits  HEPATIC FUNCTION PANEL - Abnormal; Notable for the following components:   Albumin 3.4 (*)    All other components within normal limits  URINE CULTURE  CBC  MAGNESIUM  URINALYSIS, ROUTINE W REFLEX MICROSCOPIC  CBG  MONITORING, ED    EKG EKG Interpretation  Date/Time:  Sunday Feb 16 2020 11:36:05 EDT Ventricular Rate:  60 PR Interval:    QRS Duration: 101 QT Interval:  389 QTC Calculation: 389 R Axis:   66 Text Interpretation: Normal sinus rhythm Confirmed by Quintella Reichert [phone]) on 02/16/2020 12:27:22 PM   Radiology CT Head Wo Contrast  Result Date: 02/16/2020 CLINICAL DATA:  Seizure-like activity this morning.  Encephalopathy. EXAM: CT HEAD WITHOUT CONTRAST TECHNIQUE: Contiguous axial images were obtained from the base of the skull through the vertex without intravenous contrast. COMPARISON:  04/10/2019 from [facility] FINDINGS: Brain: No evidence of acute infarction, hemorrhage, hydrocephalus, extra-axial collection, or mass lesion/mass effect. Mild diffuse cerebral atrophy and chronic small vessel disease shows no significant change. Vascular:  No hyperdense vessel or other acute findings. Skull: No evidence of fracture or other significant bone abnormality. Sinuses/Orbits:  No acute findings. Other: None. IMPRESSION: No acute intracranial abnormality. 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 Electronically Signed   By: Marlaine Hind M.D.   On: 02/16/2020 11:10   Procedures Procedures   Medications Ordered in ED Medications - No data to display  ED Course  I have reviewed the triage vital signs and the nursing notes.  Pertinent labs & imaging results that were available during my care of the patient were reviewed by me and considered in my medical decision making (see chart for details).    MDM Rules/Calculators/A&P                      Patient is a 79 year old male with signs and symptoms as noted above.  He presents to the emergency department status post what appears to be a seizure episode.  Patient has no history of seizures.  His  vital signs of been stable since arrival.  He is mildly hypertensive.  Physical exam is reassuring.  His  neurological exam is benign.  His wife is a retired [employer] scientist and states that after the initial 10 to 15-minute period where he was not clearly answering questions, he has been behaving at baseline and is currently behaving at baseline.  Due to this being his first seizure, obtain a CT scan of the head.  This was negative for acute abnormalities.  Initial labs resulted which were generally reassuring.  His PT/INR was 25 as well as 2.4, respectively.  He is anticoagulated with Coumadin due to a history of factor V Leiden.  His numbers seem appropriate given his history.  I discussed this patient with Dr. Lorraine Lax in neurology.  He agrees that this was likely a seizure episode.  Due to him being back at baseline and this being his first occurrence I agreed with the CT scan.  He recommends that we follow-up with an MRI.  If the MRI is negative he believes that the patient is good to follow-up with neurology on an outpatient basis.  It is the end of my shift and patient care will be transferred to [facility].   Final Clinical Impression(s) / ED Diagnoses Final diagnoses:  Seizure-like activity ([place])  Altered mental status, unspecified altered mental status type   Rx / DC Orders ED Discharge Orders    None       Rayna Sexton, PA-C 02/16/20 1543    Quintella Reichert, MD 02/17/20 [phone]

## 2020-02-16 NOTE — ED Triage Notes (Signed)
Pt arrives via [facility] from home, ems reports pts son heard patient yell out, went in the bedroom to find patient with what sounded to be seizure lke activity, pt was tensed up and jerking. Pt was incontinent, son states that after the event, patient was combative and confused. Pt a/ox4 upon ems arrival, no known hx of seizures. No neuro deficits noted. EMS VS 182/104, HR 94, 94% ra, CBG 169.

## 2020-02-16 NOTE — ED Notes (Signed)
Pt requested to eat.  Provider agreed.  RN requested unit secretary place order for meal.

## 2020-02-16 NOTE — ED Notes (Signed)
Pt served dinner

## 2020-02-17 ENCOUNTER — Inpatient Hospital Stay (HOSPITAL_COMMUNITY): Payer: Medicare HMO

## 2020-02-17 DIAGNOSIS — G4733 Obstructive sleep apnea (adult) (pediatric): Secondary | ICD-10-CM | POA: Diagnosis not present

## 2020-02-17 DIAGNOSIS — R569 Unspecified convulsions: Secondary | ICD-10-CM

## 2020-02-17 LAB — CBC
HCT: 40 % (ref 39.0–52.0)
Hemoglobin: 12.8 g/dL — ABNORMAL LOW (ref 13.0–17.0)
MCH: 30.8 pg (ref 26.0–34.0)
MCHC: 32 g/dL (ref 30.0–36.0)
MCV: 96.4 fL (ref 80.0–100.0)
Platelets: 156 10*3/uL (ref 150–400)
RBC: 4.15 MIL/uL — ABNORMAL LOW (ref 4.22–5.81)
RDW: 13.7 % (ref 11.5–15.5)
WBC: 4.2 10*3/uL (ref 4.0–10.5)
nRBC: 0 % (ref 0.0–0.2)

## 2020-02-17 LAB — BASIC METABOLIC PANEL
Anion gap: 8 (ref 5–15)
BUN: 16 mg/dL (ref 8–23)
CO2: 27 mmol/L (ref 22–32)
Calcium: 9.1 mg/dL (ref 8.9–10.3)
Chloride: 107 mmol/L (ref 98–111)
Creatinine, Ser: 1.08 mg/dL (ref 0.61–1.24)
GFR calc Af Amer: >60 mL/min (ref >60)
GFR calc non Af Amer: >60 mL/min (ref >60)
Glucose, Bld: 117 mg/dL — ABNORMAL HIGH (ref 70–99)
Potassium: 4.2 mmol/L (ref 3.5–5.1)
Sodium: 142 mmol/L (ref 135–145)

## 2020-02-17 MED ORDER — ALLOPURINOL 100 MG PO TABS
100.0000 mg | ORAL_TABLET | Freq: Every day | ORAL | Status: DC
  Administered 2020-02-17: 100 mg via ORAL
  Filled 2020-02-17: qty 1

## 2020-02-17 MED ORDER — FOLIC ACID 1 MG PO TABS
1.0000 mg | ORAL_TABLET | Freq: Every day | ORAL | Status: DC
  Administered 2020-02-17: 1 mg via ORAL
  Filled 2020-02-17: qty 1

## 2020-02-17 MED ORDER — VITAMIN B-12 1000 MCG PO TABS
1000.0000 ug | ORAL_TABLET | Freq: Every day | ORAL | Status: DC
  Administered 2020-02-17: 1000 ug via ORAL
  Filled 2020-02-17: qty 1

## 2020-02-17 NOTE — Care Management Obs Status (Signed)
[place] NOTIFICATION   Patient Details  Name: Tony Hinton MRN: [id number] Date of Birth: [birth year]   Medicare Observation Status Notification Given:  Yes    Ninfa Meeker, RN 02/17/2020, 2:22 PM

## 2020-02-17 NOTE — Progress Notes (Signed)
EEG complete - results pending 

## 2020-02-17 NOTE — Progress Notes (Signed)
RN gave pt discharge instructions and the patient stated understanding. IV has been removed and the patient family ay bedside no new medications have been added.

## 2020-02-17 NOTE — Care Management CC44 (Signed)
Condition Code 44 Documentation Completed  Patient Details  Name: Tony Hinton MRN: [id number] Date of Birth: 10/13/1940   Condition Code 44 given:  Yes Patient signature on Condition Code 44 notice:  (CM working remotely, spoke with patient's wife via telephone to explain) Documentation of 2 MD's agreement:  Yes Code 44 added to claim:  Yes    Ninfa Meeker, RN 02/17/2020, 12:12 PM

## 2020-02-17 NOTE — Procedures (Signed)
Patient Name: Tony Hinton  MRN: [id number]  Epilepsy Attending: Lora Havens  Referring Physician/Provider: Dr. Darliss Cheney Date: 02/17/2020 Duration: 25.43 mins  Patient history: 79 year old male presented with seizure-like episode.  EEG to evaluate for seizures.  Level of alertness: Awake, asleep  AEDs during EEG study: None  Technical aspects: This EEG study was done with scalp electrodes positioned according to the 10-20 International system of electrode placement. Electrical activity was acquired at a sampling rate of 500Hz  and reviewed with a high frequency filter of 70Hz  and a low frequency filter of 1Hz . EEG data were recorded continuously and digitally stored.   Description: The posterior dominant rhythm consists of 8.5 Hz activity of moderate voltage (25-35 uV) seen predominantly in posterior head regions, symmetric and reactive to eye opening and eye closing. Sleep was characterized by vertex waves, sleep spindles (12 to 14 Hz), maximal frontocentral region. Physiology photic driving was seen during photic stimulation.  Hyperventilation was not performed.     IMPRESSION: This study is within normal limits. No seizures or epileptiform discharges were seen throughout the recording.  Kadijah Shamoon Barbra Sarks
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
## 2020-02-17 NOTE — Discharge Instructions (Signed)
Non-Epileptic Seizures, Adult A seizure can cause:  Involuntary movements, like falling or shaking.  Changes in awareness or consciousness.  Convulsions. These are episodes of uncontrollable, jerking movement caused by sudden, intense tightening (contraction) of the muscles. Epileptic seizures are caused by abnormal electrical activity in the brain. Non-epileptic seizures are different. They are not caused by abnormal electrical signals in your brain. These seizures may look like epileptic seizures, but they are not caused by epilepsy. There are two types of non-epileptic seizures:  Physiologic non-epileptic seizure. This type results from an underlying problem that causes a disruption in your brain's electrical activity.  Psychogenic non-epileptic seizure. This type results from emotional stress. These seizures are sometimes called pseudoseizures. What are the causes? Causes of physiologic non-epileptic seizures can include:  Sudden drop in blood pressure.  Low blood sugar (glucose).  Low levels of salt (sodium) in your blood.  Low levels of calcium in your blood.  Migraine.  Heart rhythm problems.  Sleep disorders, such as narcolepsy.  Movement disorders, such as Tourette syndrome.  Infection.  Certain medicines.  Drug and alcohol abuse.  Fever. Common causes of psychogenic non-epileptic seizures can include:  Stress.  Emotional trauma.  Sexual or physical abuse.  Major life events, such as divorce or death of a loved one.  Mental health disorders, including anxiety and depression. What are the signs or symptoms? Symptoms of a non-epileptic seizure can be similar to those of an epileptic seizure, which may include:  A change in attention or behavior (altered mental status).  Loss of consciousness or fainting.  Convulsions with rhythmic jerking movements.  Drooling.  Rapid eye movements.  Grunting.  Loss of bladder control and bowel  control.  Bitter taste in the mouth.  Tongue biting. Some people experience unusual sensations (aura) before having a seizure. These can include:  "Butterflies" in the stomach.  Abnormal smells or tastes.  A feeling of having had a new experience before (dj vu). After a non-epileptic seizure, you may have a headache or sore muscles or feel confused and sleepy. Non-epileptic seizures usually:  Do not cause physical injuries.  Start slowly.  Include crying or shrieking.  Last longer than 2 minutes.  Include pelvic thrusting. How is this diagnosed? Non-epileptic seizures may be diagnosed by:  Your medical history.  A physical exam.  Your symptoms. ? Your health care provider may want to talk with your friends or relatives who have seen you have a seizure. ? If possible, it is helpful if you write down your seizure activity, including what led up to the seizure, and share that information with your health care provider. You may also need to have tests to look for causes of physiologic non-epileptic seizures. These may include:  An electroencephalogram (EEG). This test measures electrical activity in your brain. If you have had a non-epileptic seizure, the results of your EEG will likely be normal.  Video EEG. This test takes place in the hospital over the course of 2-7 days. The test uses a video camera and an EEG to monitor your symptoms and the electrical activity in your brain.  Blood tests.  Lumbar puncture. This test involves pulling fluid from your spine to check for infection.  Electrocardiogram (ECG or EKG). This test checks for an abnormal heart rhythm.  CT scan. If your health care provider thinks you have had a psychogenic non-epileptic seizure, you may need to see a mental health specialist for an evaluation. How is this treated? The treatment for your  seizures will depend on what is causing them. When the underlying condition is treated, your seizures should  stop. If your seizures are being caused by emotional trauma or stress, your health care provider may recommend that you see a mental health professional. Treatment may include:  Relaxation therapy or cognitive behavioral therapy.  Medicines to treat depression or anxiety.  Individual or family counseling. In some cases, you may have psychogenic seizures in addition to epileptic seizures. If this is the case, you may be prescribed medicine to help with the epileptic seizures. Follow these instructions at home: Home care will depend on the type of non-epileptic seizures that you have. In general:  Follow all instructions from your health care provider. These may include ways to prevent seizures and what to do if you have a seizure.  Take over-the-counter and prescription medicines only as told by your health care provider.  Keep all follow-up visits as told by your health care provider. This is important.  Make sure family members, friends, and coworkers are trained on how to help you if you have a seizure. If you have a seizure, they should: ? Lay you on the ground to prevent a fall. ? Place a pillow or piece of clothing under your head. ? Loosen any clothing around your neck. ? Turn you onto your side. If vomiting occurs, this helps keep your airway clear.  Avoid any substances that may prevent your medicine from working properly. If you are prescribed medicine for seizures: ? Do not use recreational drugs. ? Limit or avoid alcoholic beverages. Contact a health care provider if:  Your seizures change or become more frequent.  You continue to have seizures after treatment. Get help right away if:  You injure yourself during a seizure.  You have one seizure after another.  You have trouble recovering from a seizure.  You have chest pain or trouble breathing.  You have a seizure that lasts longer than 5 minutes. Summary  Non-epileptic seizures may look like epileptic  seizures, but they are not caused by epilepsy.  The treatment for your seizures will depend on what is causing them. When the underlying condition is treated, your seizures should stop.  Make sure family members, friends, and coworkers are trained on how to help you if you have a seizure. If you have a seizure, they should lay you on the ground to prevent a fall, protect your head and neck, and turn you onto your side. This information is not intended to replace advice given to you by your health care provider. Make sure you discuss any questions you have with your health care provider. Document Revised: 08/18/2017 Document Reviewed: 06/17/2016 Elsevier Patient Education  2020 [employer].

## 2020-02-18 LAB — URINE CULTURE: Culture: 60000 — AB

## 2020-09-12 IMAGING — MR MR SHOULDER*R* W/O CM
4 of 5 series · 19 of 40 positions shown · non-contrast
Comparison: None.

CLINICAL DATA: Shoulder pain for 3-4 months.

EXAM:
MRI OF THE RIGHT SHOULDER WITHOUT CONTRAST
TECHNIQUE: Multiplanar, multisequence MR imaging of the shoulder was performed.
No intravenous contrast was administered.

[Series 3: PD · axial · 4.0mm · 0.27mm/px · z∈[-46,+44]mm · 6 of 25 slices shown]
[im 1/25]
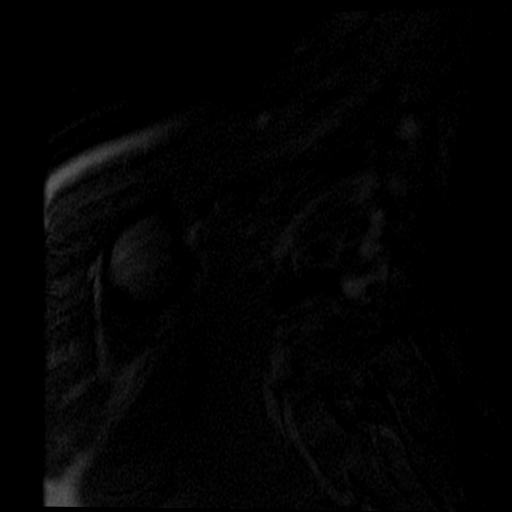
[im 3/25]
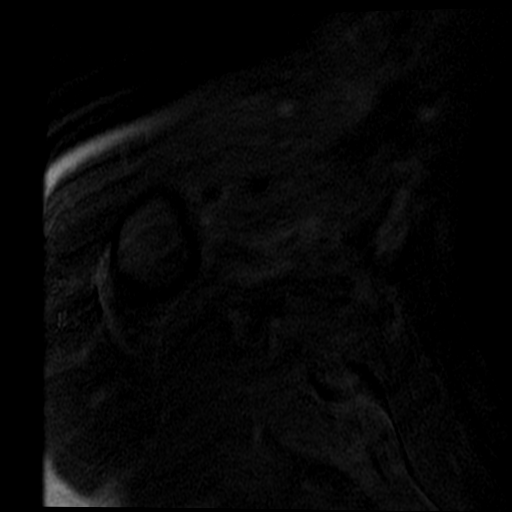
[im 5/25]
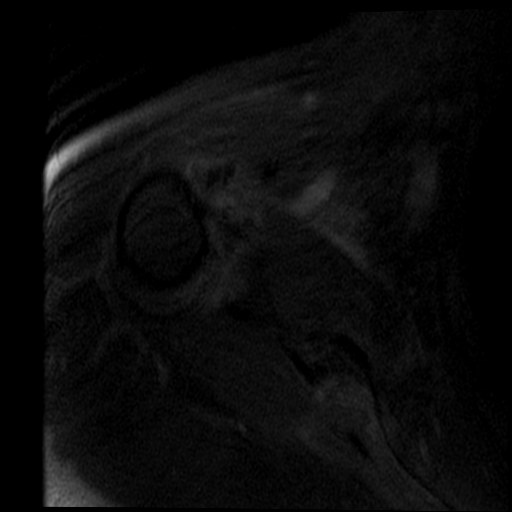
[im 8/25]
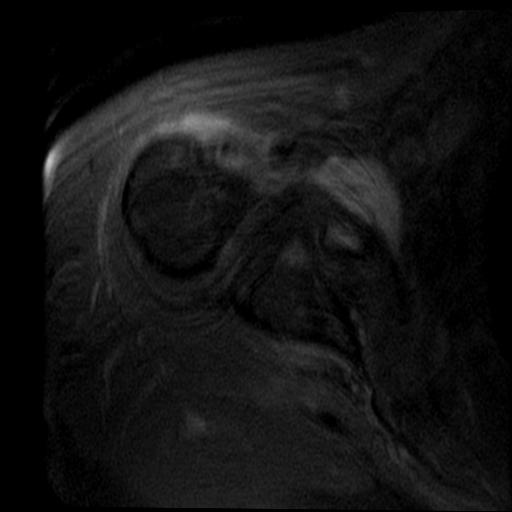
[im 13/25]
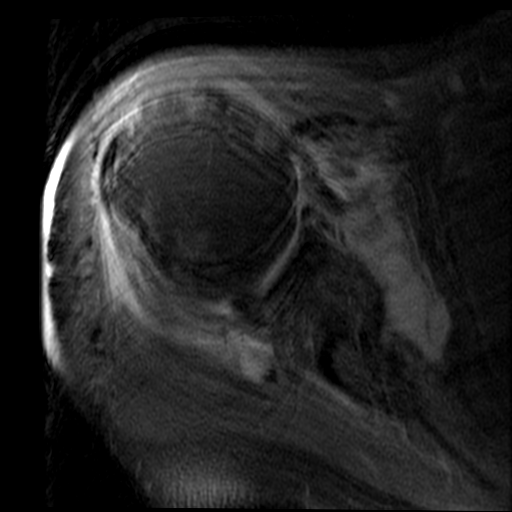
[im 22/25]
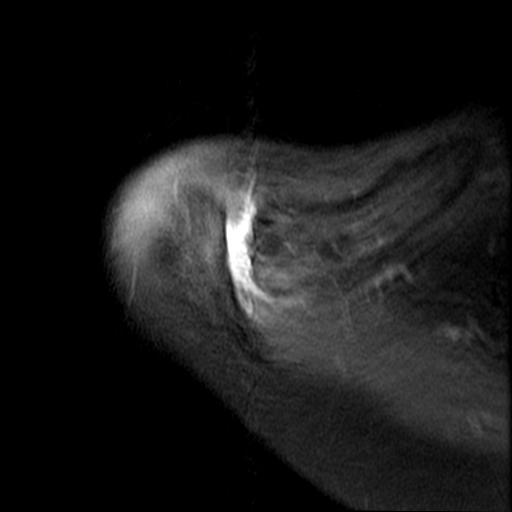

[Series 4: T2 fat-sat · oblique · 4.0mm · 0.27mm/px · 3 of 18 slices shown (1 of 2)]
[im 3/18]
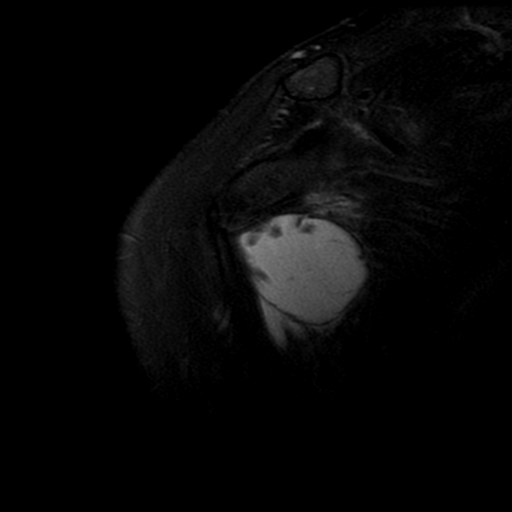
[im 10/18]
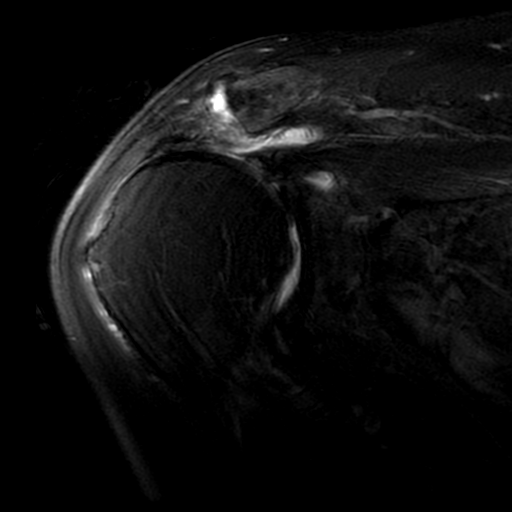
[im 15/18]
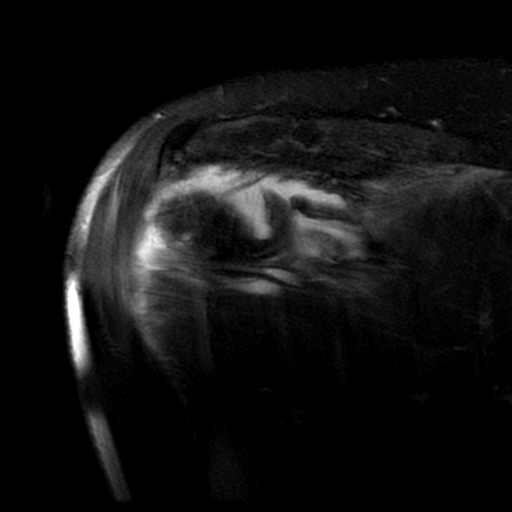

[Series 5: PD fat-sat · oblique · 4.0mm · 0.27mm/px · 7 of 18 slices shown]
[im 1/18]
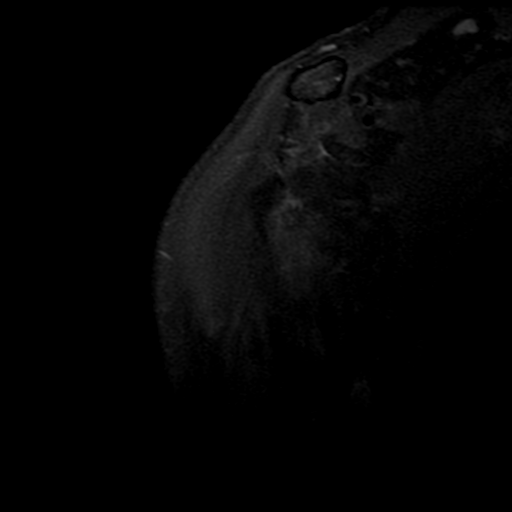
[im 3/18]
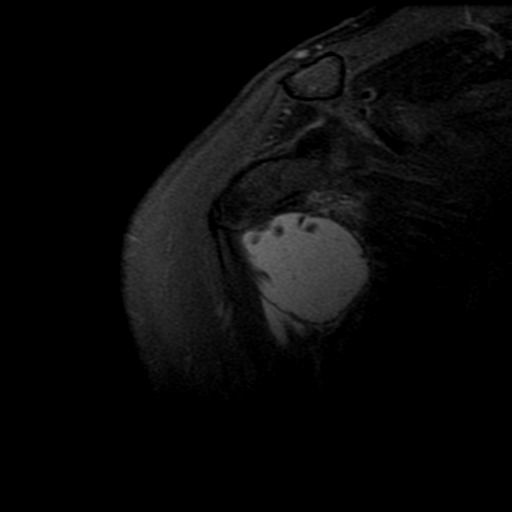
[im 6/18]
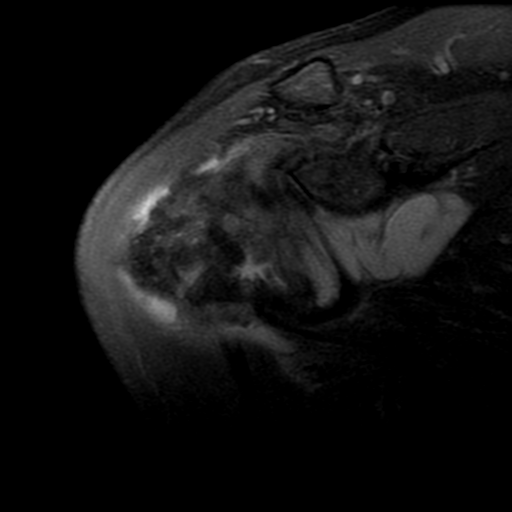
[im 9/18]
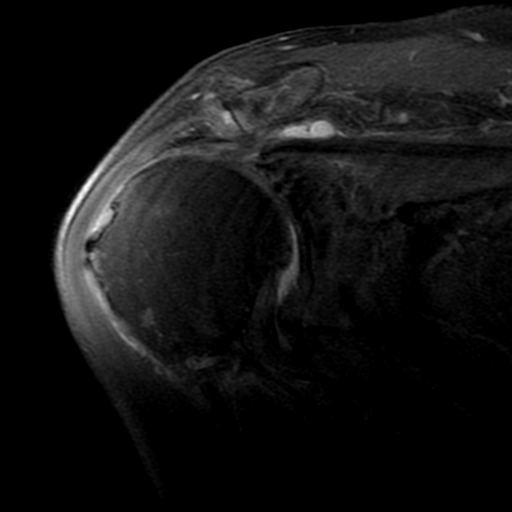
[im 12/18]
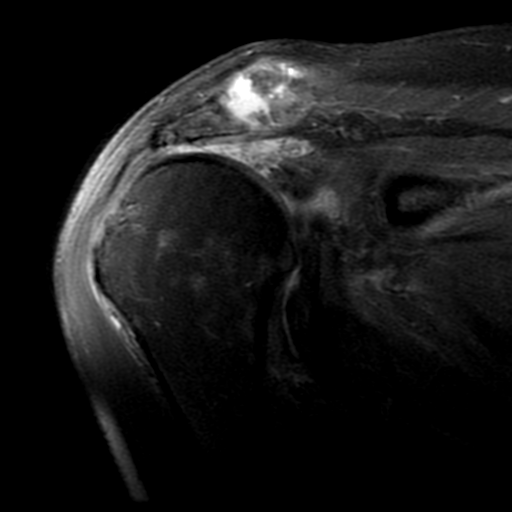
[im 15/18]
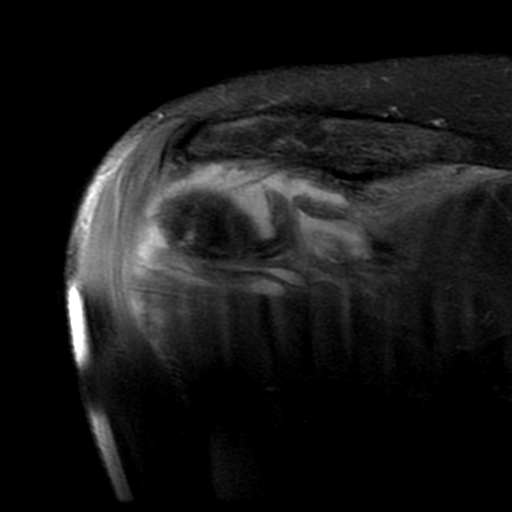
[im 18/18]
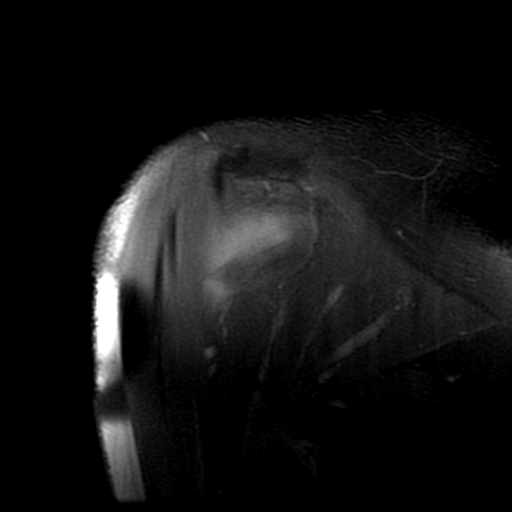

[Series 6: T2 fat-sat · oblique · 4.0mm · 0.29mm/px · 3 of 18 slices shown (2 of 2)]
[im 3/18]
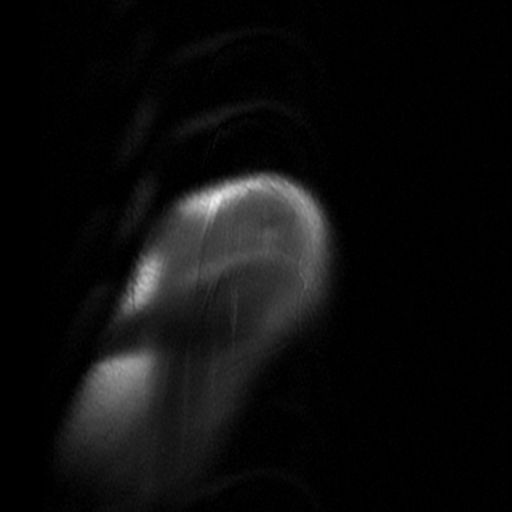
[im 9/18]
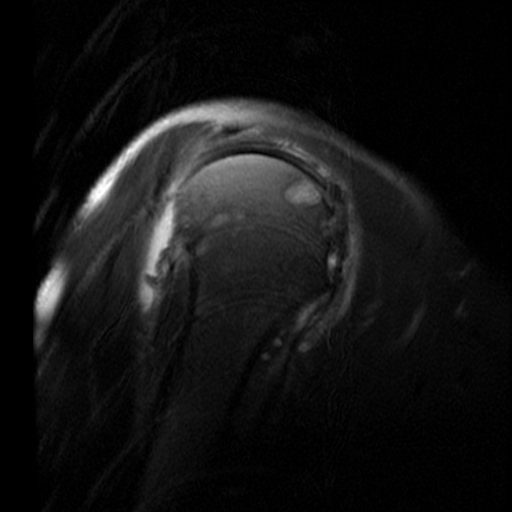
[im 15/18]
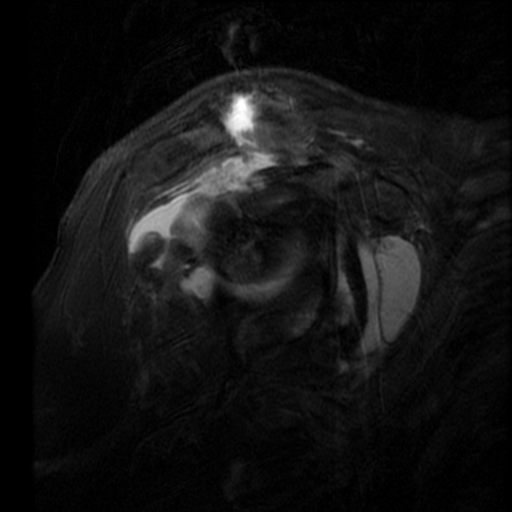

[19 of 40 positions shown; findings below may reference images not displayed]

FINDINGS: Rotator cuff: Complete tear of the supraspinatus and infraspinatus
tendons with 5 cm of retraction. Teres minor tendon is intact. Mild
tendinosis of subscapularis tendon with a small partial tear.

Muscles: No atrophy or fatty replacement of nor abnormal signal
within, the muscles of the rotator cuff.

Biceps long head: Mild tendinosis of the intra-articular portion of
the long head of the biceps tendon.

Acromioclavicular Joint: Severe arthropathy of the acromioclavicular
joint. Type II acromion. Large amount of subacromial/subdeltoid
bursal fluid.

Glenohumeral Joint: Small joint effusion. Partial-thickness
cartilage loss of the glenohumeral joint.

Labrum: Grossly intact, but evaluation is limited by lack of
intraarticular fluid.

Bones:  No acute osseous abnormality.  No aggressive osseous lesion.

Other: No fluid collection or hematoma.
IMPRESSION: 1. Complete tear of the supraspinatus and infraspinatus tendons with
5 cm of retraction.
2. Mild tendinosis of subscapularis tendon with a small partial
tear.
3. Mild tendinosis of the intra-articular portion of the long head
of the biceps tendon.
4. Severe subacromial/subdeltoid bursitis.
5. Mild osteoarthritis of the glenohumeral joint.

## 2021-10-25 DIAGNOSIS — Z86718 Personal history of other venous thrombosis and embolism: Secondary | ICD-10-CM | POA: Diagnosis not present

## 2021-11-29 DIAGNOSIS — Z947 Corneal transplant status: Secondary | ICD-10-CM | POA: Diagnosis not present

## 2021-12-09 DIAGNOSIS — I82409 Acute embolism and thrombosis of unspecified deep veins of unspecified lower extremity: Secondary | ICD-10-CM | POA: Diagnosis not present

## 2021-12-09 DIAGNOSIS — M1A09X0 Idiopathic chronic gout, multiple sites, without tophus (tophi): Secondary | ICD-10-CM | POA: Diagnosis not present

## 2021-12-09 DIAGNOSIS — J439 Emphysema, unspecified: Secondary | ICD-10-CM | POA: Diagnosis not present

## 2021-12-09 DIAGNOSIS — M5416 Radiculopathy, lumbar region: Secondary | ICD-10-CM | POA: Diagnosis not present

## 2021-12-09 DIAGNOSIS — F0280 Dementia in other diseases classified elsewhere without behavioral disturbance: Secondary | ICD-10-CM | POA: Diagnosis not present

## 2021-12-09 DIAGNOSIS — E538 Deficiency of other specified B group vitamins: Secondary | ICD-10-CM | POA: Diagnosis not present

## 2021-12-09 DIAGNOSIS — G301 Alzheimer's disease with late onset: Secondary | ICD-10-CM | POA: Diagnosis not present

## 2022-01-28 DIAGNOSIS — R030 Elevated blood-pressure reading, without diagnosis of hypertension: Secondary | ICD-10-CM | POA: Diagnosis not present

## 2022-01-28 DIAGNOSIS — R569 Unspecified convulsions: Secondary | ICD-10-CM | POA: Diagnosis not present

## 2022-01-28 DIAGNOSIS — D6851 Activated protein C resistance: Secondary | ICD-10-CM | POA: Diagnosis not present

## 2022-01-28 DIAGNOSIS — G301 Alzheimer's disease with late onset: Secondary | ICD-10-CM | POA: Diagnosis not present

## 2022-01-28 DIAGNOSIS — G4733 Obstructive sleep apnea (adult) (pediatric): Secondary | ICD-10-CM | POA: Diagnosis not present

## 2022-01-28 DIAGNOSIS — F0280 Dementia in other diseases classified elsewhere without behavioral disturbance: Secondary | ICD-10-CM | POA: Diagnosis not present

## 2022-02-08 DIAGNOSIS — Z86718 Personal history of other venous thrombosis and embolism: Secondary | ICD-10-CM | POA: Diagnosis not present

## 2022-02-24 DIAGNOSIS — Z86718 Personal history of other venous thrombosis and embolism: Secondary | ICD-10-CM | POA: Diagnosis not present

## 2022-03-29 DIAGNOSIS — Z86718 Personal history of other venous thrombosis and embolism: Secondary | ICD-10-CM | POA: Diagnosis not present

## 2022-05-24 DIAGNOSIS — Z86718 Personal history of other venous thrombosis and embolism: Secondary | ICD-10-CM | POA: Diagnosis not present

## 2022-05-30 DIAGNOSIS — Z947 Corneal transplant status: Secondary | ICD-10-CM | POA: Diagnosis not present

## 2022-05-31 ENCOUNTER — Emergency Department
Admission: EM | Admit: 2022-05-31 | Discharge: 2022-05-31 | Disposition: A | Discharge status: Home / Self Care | Payer: Medicare HMO | Attending: Emergency Medicine | Admitting: Emergency Medicine

## 2022-05-31 ENCOUNTER — Other Ambulatory Visit: Payer: Self-pay

## 2022-05-31 DIAGNOSIS — G301 Alzheimer's disease with late onset: Secondary | ICD-10-CM | POA: Diagnosis not present

## 2022-05-31 DIAGNOSIS — F0390 Unspecified dementia without behavioral disturbance: Secondary | ICD-10-CM | POA: Insufficient documentation

## 2022-05-31 DIAGNOSIS — Z7901 Long term (current) use of anticoagulants: Secondary | ICD-10-CM | POA: Diagnosis not present

## 2022-05-31 DIAGNOSIS — R569 Unspecified convulsions: Secondary | ICD-10-CM | POA: Diagnosis not present

## 2022-05-31 DIAGNOSIS — Z86718 Personal history of other venous thrombosis and embolism: Secondary | ICD-10-CM | POA: Diagnosis not present

## 2022-05-31 DIAGNOSIS — D6851 Activated protein C resistance: Secondary | ICD-10-CM | POA: Diagnosis not present

## 2022-05-31 DIAGNOSIS — F0280 Dementia in other diseases classified elsewhere without behavioral disturbance: Secondary | ICD-10-CM | POA: Diagnosis not present

## 2022-05-31 DIAGNOSIS — I10 Essential (primary) hypertension: Secondary | ICD-10-CM | POA: Diagnosis not present

## 2022-05-31 DIAGNOSIS — R030 Elevated blood-pressure reading, without diagnosis of hypertension: Secondary | ICD-10-CM | POA: Diagnosis not present

## 2022-05-31 MED ORDER — AMLODIPINE BESYLATE 5 MG PO TABS: 5.0000 mg | ORAL_TABLET | Freq: Every day | ORAL | 0 refills | Status: AC

## 2022-05-31 NOTE — ED Provider Notes (Signed)
[facility] Provider Note   Event Date/Time   First MD Initiated Contact with Patient 05/31/22 1026     (approximate) History  Hypertension  HPI TREYSON AXEL is a 81 y.o. male with a past medical history of DVT on warfarin as well Alzheimer's dementia who presents from his neurologist office with concerns of hypertension.  Patient states that he has systolic blood pressures in the 190s during his office visit today and was told to come to the emergency department.  Patient denies any complaints ROS: Patient currently denies any vision changes, tinnitus, difficulty speaking, facial droop, sore throat, chest pain, shortness of breath, abdominal pain, nausea/vomiting/diarrhea, dysuria, or weakness/numbness/paresthesias in any extremity   Physical Exam  Triage Vital Signs: ED Triage Vitals  Enc Vitals Group     BP 05/31/22 0957 (!) 168/97     Pulse Rate 05/31/22 0957 65     Resp 05/31/22 0957 18     Temp 05/31/22 0957 (!) 97.5 F (36.4 C)     Temp Source 05/31/22 0957 Oral     SpO2 05/31/22 0957 97 %     Weight 05/31/22 0958 187 lb (84.8 kg)     Height 05/31/22 0958 '5\' 10"'$  (1.778 m)     Head Circumference --      Peak Flow --      Pain Score 05/31/22 0958 0     Pain Loc --      Pain Edu? --      Excl. in [place]? --    Most recent vital signs: Vitals:   05/31/22 0957  BP: (!) 168/97  Pulse: 65  Resp: 18  Temp: (!) 97.5 F (36.4 C)  SpO2: 97%   General: Awake, oriented x4. CV:  Good peripheral perfusion.  Resp:  Normal effort.  Abd:  No distention.  Other:  Elderly the overweight Caucasian male sitting in wheelchair in no acute distress ED Results / Procedures / Treatments  PROCEDURES: Critical Care performed: No Procedures MEDICATIONS ORDERED IN ED: Medications - No data to display IMPRESSION / MDM / [place] / ED COURSE  I reviewed the triage vital signs and the nursing notes.                             The patient is on the  cardiac monitor to evaluate for evidence of arrhythmia and/or significant heart rate changes. Patient's presentation is most consistent with acute presentation with potential threat to life or bodily function. Presents to the emergency department complaining of high blood pressure. Patient is otherwise asymptomatic without confusion, chest pain, hematuria, or SOB. Denies antihypertensive regimen DDx: CV, AMI, heart failure, renal infarction or failure or other end organ damage.  Disposition: Discussed with patient their elevated blood pressure and need for close outpatient management of their hypertension. Will provide a prescription for amlodipine '5mg'$  PO daily and arrange for the patient to follow up in a primary care clinic   FINAL CLINICAL IMPRESSION(S) / ED DIAGNOSES   Final diagnoses:  Primary hypertension   Rx / DC Orders   ED Discharge Orders          Ordered    amLODipine (NORVASC) 5 MG tablet  Daily        05/31/22 1049           Note:  This document was prepared using Dragon voice recognition software and may include unintentional dictation errors.  Naaman Plummer, MD 05/31/22 1136

## 2022-05-31 NOTE — ED Triage Notes (Signed)
Pt arrives with c/o HTN. Pt was sent here from PCP. Pt denies headaches, CP, SOB, or blurry vision.

## 2022-05-31 NOTE — ED Notes (Signed)
See triage note  Presents from PCP's office with HTN   Denies any sx's  No h/a or chest pain

## 2022-06-13 DIAGNOSIS — Z86718 Personal history of other venous thrombosis and embolism: Secondary | ICD-10-CM | POA: Diagnosis not present

## 2022-07-26 DIAGNOSIS — G301 Alzheimer's disease with late onset: Secondary | ICD-10-CM | POA: Diagnosis not present

## 2022-07-26 DIAGNOSIS — R569 Unspecified convulsions: Secondary | ICD-10-CM | POA: Diagnosis not present

## 2022-07-26 DIAGNOSIS — G4733 Obstructive sleep apnea (adult) (pediatric): Secondary | ICD-10-CM | POA: Diagnosis not present

## 2022-07-26 DIAGNOSIS — F0280 Dementia in other diseases classified elsewhere without behavioral disturbance: Secondary | ICD-10-CM | POA: Diagnosis not present

## 2022-10-03 DIAGNOSIS — Z86718 Personal history of other venous thrombosis and embolism: Secondary | ICD-10-CM | POA: Diagnosis not present

## 2022-10-03 DIAGNOSIS — G4733 Obstructive sleep apnea (adult) (pediatric): Secondary | ICD-10-CM | POA: Diagnosis not present

## 2022-10-03 DIAGNOSIS — L03115 Cellulitis of right lower limb: Secondary | ICD-10-CM | POA: Diagnosis not present

## 2022-10-03 DIAGNOSIS — G301 Alzheimer's disease with late onset: Secondary | ICD-10-CM | POA: Diagnosis not present

## 2022-10-03 DIAGNOSIS — M19071 Primary osteoarthritis, right ankle and foot: Secondary | ICD-10-CM | POA: Diagnosis not present

## 2022-10-03 DIAGNOSIS — R030 Elevated blood-pressure reading, without diagnosis of hypertension: Secondary | ICD-10-CM | POA: Diagnosis not present

## 2022-10-03 DIAGNOSIS — F0280 Dementia in other diseases classified elsewhere without behavioral disturbance: Secondary | ICD-10-CM | POA: Diagnosis not present

## 2022-10-03 DIAGNOSIS — R569 Unspecified convulsions: Secondary | ICD-10-CM | POA: Diagnosis not present

## 2022-10-03 DIAGNOSIS — L603 Nail dystrophy: Secondary | ICD-10-CM | POA: Diagnosis not present

## 2022-11-16 DIAGNOSIS — J301 Allergic rhinitis due to pollen: Secondary | ICD-10-CM | POA: Diagnosis not present

## 2022-11-16 DIAGNOSIS — Z008 Encounter for other general examination: Secondary | ICD-10-CM | POA: Diagnosis not present

## 2022-11-16 DIAGNOSIS — E039 Hypothyroidism, unspecified: Secondary | ICD-10-CM | POA: Diagnosis not present

## 2022-11-16 DIAGNOSIS — Z8249 Family history of ischemic heart disease and other diseases of the circulatory system: Secondary | ICD-10-CM | POA: Diagnosis not present

## 2022-11-16 DIAGNOSIS — R030 Elevated blood-pressure reading, without diagnosis of hypertension: Secondary | ICD-10-CM | POA: Diagnosis not present

## 2022-11-16 DIAGNOSIS — J4489 Other specified chronic obstructive pulmonary disease: Secondary | ICD-10-CM | POA: Diagnosis not present

## 2022-11-16 DIAGNOSIS — I4891 Unspecified atrial fibrillation: Secondary | ICD-10-CM | POA: Diagnosis not present

## 2022-11-16 DIAGNOSIS — D6869 Other thrombophilia: Secondary | ICD-10-CM | POA: Diagnosis not present

## 2022-11-16 DIAGNOSIS — L309 Dermatitis, unspecified: Secondary | ICD-10-CM | POA: Diagnosis not present

## 2022-11-16 DIAGNOSIS — G40909 Epilepsy, unspecified, not intractable, without status epilepticus: Secondary | ICD-10-CM | POA: Diagnosis not present

## 2022-11-16 DIAGNOSIS — G309 Alzheimer's disease, unspecified: Secondary | ICD-10-CM | POA: Diagnosis not present

## 2022-11-16 DIAGNOSIS — M109 Gout, unspecified: Secondary | ICD-10-CM | POA: Diagnosis not present

## 2022-11-16 DIAGNOSIS — I739 Peripheral vascular disease, unspecified: Secondary | ICD-10-CM | POA: Diagnosis not present

## 2022-11-28 DIAGNOSIS — Z961 Presence of intraocular lens: Secondary | ICD-10-CM | POA: Diagnosis not present

## 2022-11-28 DIAGNOSIS — Z947 Corneal transplant status: Secondary | ICD-10-CM | POA: Diagnosis not present

## 2022-11-29 ENCOUNTER — Other Ambulatory Visit (HOSPITAL_COMMUNITY): Payer: Self-pay

## 2022-12-13 DIAGNOSIS — Z86718 Personal history of other venous thrombosis and embolism: Secondary | ICD-10-CM | POA: Diagnosis not present

## 2022-12-13 DIAGNOSIS — Z7901 Long term (current) use of anticoagulants: Secondary | ICD-10-CM | POA: Diagnosis not present

## 2023-03-06 DIAGNOSIS — D6851 Activated protein C resistance: Secondary | ICD-10-CM | POA: Diagnosis not present

## 2023-03-13 DIAGNOSIS — Z86718 Personal history of other venous thrombosis and embolism: Secondary | ICD-10-CM | POA: Diagnosis not present

## 2023-03-13 DIAGNOSIS — Z7901 Long term (current) use of anticoagulants: Secondary | ICD-10-CM | POA: Diagnosis not present

## 2023-05-01 DIAGNOSIS — Z86718 Personal history of other venous thrombosis and embolism: Secondary | ICD-10-CM | POA: Diagnosis not present

## 2023-05-01 DIAGNOSIS — Z7901 Long term (current) use of anticoagulants: Secondary | ICD-10-CM | POA: Diagnosis not present

## 2023-05-13 ENCOUNTER — Emergency Department
Admission: EM | Admit: 2023-05-13 | Discharge: 2023-05-13 | Disposition: A | Discharge status: Home / Self Care | Payer: Medicare HMO | Source: Home / Self Care | Attending: Emergency Medicine | Admitting: Emergency Medicine

## 2023-05-13 ENCOUNTER — Emergency Department: Payer: Medicare HMO

## 2023-05-13 ENCOUNTER — Other Ambulatory Visit: Payer: Self-pay

## 2023-05-13 DIAGNOSIS — J449 Chronic obstructive pulmonary disease, unspecified: Secondary | ICD-10-CM | POA: Insufficient documentation

## 2023-05-13 DIAGNOSIS — M7989 Other specified soft tissue disorders: Secondary | ICD-10-CM | POA: Diagnosis not present

## 2023-05-13 DIAGNOSIS — E039 Hypothyroidism, unspecified: Secondary | ICD-10-CM | POA: Diagnosis not present

## 2023-05-13 DIAGNOSIS — Z7901 Long term (current) use of anticoagulants: Secondary | ICD-10-CM | POA: Insufficient documentation

## 2023-05-13 DIAGNOSIS — M25462 Effusion, left knee: Secondary | ICD-10-CM | POA: Diagnosis not present

## 2023-05-13 DIAGNOSIS — Z8547 Personal history of malignant neoplasm of testis: Secondary | ICD-10-CM | POA: Diagnosis not present

## 2023-05-13 DIAGNOSIS — M129 Arthropathy, unspecified: Secondary | ICD-10-CM | POA: Diagnosis not present

## 2023-05-13 HISTORY — DX: Essential (primary) hypertension: I10

## 2023-05-13 NOTE — Discharge Instructions (Signed)
Continue taking Tylenol as needed.  Keep the knee elevated and apply ice.  Follow-up with your primary care provider.  Return to the ER for new, worsening, or persistent severe pain, swelling, inability to bend the knee, redness, warm knee joint, rash or streaking to the leg, or any other new or worsening symptoms that concern you.

## 2023-05-13 NOTE — ED Triage Notes (Signed)
Pt to ED for L knee swelling, woke up with this today. No pain except if bends knee. Takes Coumadin. No recent injury. Pt in NAD, with son.

## 2023-05-13 NOTE — ED Provider Notes (Signed)
[facility] Provider Note    Event Date/Time   First MD Initiated Contact with Patient 05/13/23 1728     (approximate)   History   Joint Swelling   HPI  Tony Hinton is a 82 y.o. 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  The patient denies any specific trauma or injury to the knee that he can recall.  He states that his knee does not hurt when it is still, but it hurts to try to bend it.  He denies any swelling to the rest of the leg.  He has no fevers or chills.  He denies other joint pain or swelling.  He has had gout in the past.  I reviewed the past medical records per the patient's most recent inpatient admission was to the hospitalist service in 2021 for seizures.   Physical Exam   Triage Vital Signs: ED Triage Vitals  Encounter Vitals Group     BP 05/13/23 1604 (!) 137/90     Systolic BP Percentile --      Diastolic BP Percentile --      Pulse Rate 05/13/23 1604 77     Resp 05/13/23 1604 16     Temp 05/13/23 1604 97.8 F (36.6 C)     Temp Source 05/13/23 1604 Oral     SpO2 05/13/23 1604 94 %     Weight 05/13/23 1605 240 lb (108.9 kg)     Height 05/13/23 1605 5\' 9"  (1.753 m)     Head Circumference --      Peak Flow --      Pain Score 05/13/23 1608 2     Pain Loc --      Pain Education --      Exclude from Growth Chart --     Most recent vital signs: Vitals:   05/13/23 1604  BP: (!) 137/90  Pulse: 77  Resp: 16  Temp: 97.8 F (36.6 C)  SpO2: 94%     General: Awake, no distress.  CV:  Good peripheral perfusion.  Resp:  Normal effort.  Abd:  No distention.  Other:  Left knee with moderate effusion.  No erythema, induration, or abnormal warmth to the joint.  Limited range of motion, however patient is able to flex the knee to about 90 degrees and extend to about 150 degrees.  No calf or  popliteal swelling or tenderness.  Mild swelling to the distal thigh.   ED Results / Procedures / Treatments   Labs (all labs ordered are listed, but only abnormal results are displayed) Labs Reviewed - No data to display   EKG     RADIOLOGY  XR L knee: I independently viewed and interpreted the images; there is no acute fracture.  IMPRESSION:  1. Very large knee effusion, similar to slightly greater than shown  on MRI of 02/29/2008.  2. 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  3. Possible chondrocalcinosis.  4. Atheromatous vascular calcifications.    US venous LLE:   IMPRESSION:  No evidence of left lower extremity DVT. Please correlate with  separate knee x-ray of earlier 05/13/2019    PROCEDURES:  Critical Care performed: No  Procedures   MEDICATIONS ORDERED IN ED: Medications - No data to display   IMPRESSION / MDM / ASSESSMENT [employer] PLAN / ED COURSE  I reviewed the triage vital signs  and the nursing notes.  82 year old male with PMH as noted above presents with atraumatic left knee effusion and some pain and decreased ROM.  On exam there is an effusion but no warmth or erythema.  The patient is able to range the knee somewhat.  He is overall well-appearing.  Differential diagnosis includes, but is not limited to, effusion due to soft tissue or ligamentous injury, arthritis/DJD, less likely occult fracture.  Since there is some swelling to the distal thigh as well differential includes DVT especially given the patient's history of factor V Leiden.  There is no evidence of gout or septic arthritis, or any indication for arthrocentesis at this time.  We will obtain x-ray, ultrasound, and reassess.  Patient's presentation is most consistent with acute complicated illness / injury requiring diagnostic workup.  ----------------------------------------- 7:23 PM on 05/13/2023 -----------------------------------------  Ultrasound and x-ray are negative  for acute findings other than the large knee effusion.  I suspect that this is likely related to the patient's degenerative arthropathy.  The patient is stable for discharge home.  I counseled him on the results of the workup.  I recommended that he elevate and ice the leg and follow-up with his PMD.  He will take Tylenol for pain.  I gave strict return precautions and he expressed understanding.   FINAL CLINICAL IMPRESSION(S) / ED DIAGNOSES   Final diagnoses:  Effusion of left knee     Rx / DC Orders   ED Discharge Orders     None        Note:  This document was prepared using Dragon voice recognition software and may include unintentional dictation errors.    Dionne Bucy, MD 05/13/23 1925

## 2023-05-25 DIAGNOSIS — Z86718 Personal history of other venous thrombosis and embolism: Secondary | ICD-10-CM | POA: Diagnosis not present

## 2023-05-25 DIAGNOSIS — J439 Emphysema, unspecified: Secondary | ICD-10-CM | POA: Diagnosis not present

## 2023-05-25 DIAGNOSIS — M1712 Unilateral primary osteoarthritis, left knee: Secondary | ICD-10-CM | POA: Diagnosis not present

## 2023-05-25 DIAGNOSIS — F0390 Unspecified dementia without behavioral disturbance: Secondary | ICD-10-CM | POA: Diagnosis not present

## 2023-05-25 DIAGNOSIS — D6851 Activated protein C resistance: Secondary | ICD-10-CM | POA: Diagnosis not present

## 2023-05-25 DIAGNOSIS — Z7901 Long term (current) use of anticoagulants: Secondary | ICD-10-CM | POA: Diagnosis not present

## 2023-06-05 DIAGNOSIS — G8929 Other chronic pain: Secondary | ICD-10-CM | POA: Diagnosis not present

## 2023-06-05 DIAGNOSIS — M25462 Effusion, left knee: Secondary | ICD-10-CM | POA: Diagnosis not present

## 2023-06-05 DIAGNOSIS — Z7901 Long term (current) use of anticoagulants: Secondary | ICD-10-CM | POA: Diagnosis not present

## 2023-06-05 DIAGNOSIS — M25562 Pain in left knee: Secondary | ICD-10-CM | POA: Diagnosis not present

## 2023-06-05 DIAGNOSIS — M1712 Unilateral primary osteoarthritis, left knee: Secondary | ICD-10-CM | POA: Diagnosis not present

## 2023-06-05 DIAGNOSIS — Z86718 Personal history of other venous thrombosis and embolism: Secondary | ICD-10-CM | POA: Diagnosis not present

## 2023-07-03 DIAGNOSIS — Z86718 Personal history of other venous thrombosis and embolism: Secondary | ICD-10-CM | POA: Diagnosis not present

## 2023-07-03 DIAGNOSIS — Z7901 Long term (current) use of anticoagulants: Secondary | ICD-10-CM | POA: Diagnosis not present

## 2023-07-19 DIAGNOSIS — Z7901 Long term (current) use of anticoagulants: Secondary | ICD-10-CM | POA: Diagnosis not present

## 2023-07-19 DIAGNOSIS — Z86718 Personal history of other venous thrombosis and embolism: Secondary | ICD-10-CM | POA: Diagnosis not present

## 2023-07-26 DIAGNOSIS — Z1331 Encounter for screening for depression: Secondary | ICD-10-CM | POA: Diagnosis not present

## 2023-07-26 DIAGNOSIS — L309 Dermatitis, unspecified: Secondary | ICD-10-CM | POA: Diagnosis not present

## 2023-07-26 DIAGNOSIS — E039 Hypothyroidism, unspecified: Secondary | ICD-10-CM | POA: Diagnosis not present

## 2023-07-26 DIAGNOSIS — G4730 Sleep apnea, unspecified: Secondary | ICD-10-CM | POA: Diagnosis not present

## 2023-07-26 DIAGNOSIS — D6851 Activated protein C resistance: Secondary | ICD-10-CM | POA: Diagnosis not present

## 2023-07-26 DIAGNOSIS — Z7901 Long term (current) use of anticoagulants: Secondary | ICD-10-CM | POA: Diagnosis not present

## 2023-07-26 DIAGNOSIS — R569 Unspecified convulsions: Secondary | ICD-10-CM | POA: Diagnosis not present

## 2023-07-26 DIAGNOSIS — E785 Hyperlipidemia, unspecified: Secondary | ICD-10-CM | POA: Diagnosis not present

## 2023-07-26 DIAGNOSIS — D519 Vitamin B12 deficiency anemia, unspecified: Secondary | ICD-10-CM | POA: Diagnosis not present

## 2023-07-26 DIAGNOSIS — Z1389 Encounter for screening for other disorder: Secondary | ICD-10-CM | POA: Diagnosis not present

## 2023-07-26 DIAGNOSIS — M109 Gout, unspecified: Secondary | ICD-10-CM | POA: Diagnosis not present

## 2023-07-26 DIAGNOSIS — J4489 Other specified chronic obstructive pulmonary disease: Secondary | ICD-10-CM | POA: Diagnosis not present

## 2023-07-26 DIAGNOSIS — Z23 Encounter for immunization: Secondary | ICD-10-CM | POA: Diagnosis not present

## 2023-07-26 DIAGNOSIS — Z86718 Personal history of other venous thrombosis and embolism: Secondary | ICD-10-CM | POA: Diagnosis not present

## 2023-07-26 DIAGNOSIS — Z0000 Encounter for general adult medical examination without abnormal findings: Secondary | ICD-10-CM | POA: Diagnosis not present

## 2023-08-11 DIAGNOSIS — Z7901 Long term (current) use of anticoagulants: Secondary | ICD-10-CM | POA: Diagnosis not present

## 2023-08-11 DIAGNOSIS — Z86718 Personal history of other venous thrombosis and embolism: Secondary | ICD-10-CM | POA: Diagnosis not present

## 2023-08-23 DIAGNOSIS — Z7901 Long term (current) use of anticoagulants: Secondary | ICD-10-CM | POA: Diagnosis not present

## 2023-08-23 DIAGNOSIS — Z86718 Personal history of other venous thrombosis and embolism: Secondary | ICD-10-CM | POA: Diagnosis not present

## 2023-09-06 DIAGNOSIS — Z7901 Long term (current) use of anticoagulants: Secondary | ICD-10-CM | POA: Diagnosis not present

## 2023-09-06 DIAGNOSIS — Z86718 Personal history of other venous thrombosis and embolism: Secondary | ICD-10-CM | POA: Diagnosis not present

## 2023-09-19 DIAGNOSIS — Z86718 Personal history of other venous thrombosis and embolism: Secondary | ICD-10-CM | POA: Diagnosis not present

## 2023-09-19 DIAGNOSIS — Z7901 Long term (current) use of anticoagulants: Secondary | ICD-10-CM | POA: Diagnosis not present

## 2023-10-17 DIAGNOSIS — Z86718 Personal history of other venous thrombosis and embolism: Secondary | ICD-10-CM | POA: Diagnosis not present

## 2023-10-17 DIAGNOSIS — Z7901 Long term (current) use of anticoagulants: Secondary | ICD-10-CM | POA: Diagnosis not present

## 2023-10-30 DIAGNOSIS — G301 Alzheimer's disease with late onset: Secondary | ICD-10-CM | POA: Diagnosis not present

## 2023-10-30 DIAGNOSIS — R569 Unspecified convulsions: Secondary | ICD-10-CM | POA: Diagnosis not present

## 2023-10-30 DIAGNOSIS — F0280 Dementia in other diseases classified elsewhere without behavioral disturbance: Secondary | ICD-10-CM | POA: Diagnosis not present

## 2023-11-16 DIAGNOSIS — Z86718 Personal history of other venous thrombosis and embolism: Secondary | ICD-10-CM | POA: Diagnosis not present

## 2023-11-16 DIAGNOSIS — Z7901 Long term (current) use of anticoagulants: Secondary | ICD-10-CM | POA: Diagnosis not present

## 2023-12-13 ENCOUNTER — Ambulatory Visit: Payer: Medicare HMO | Admitting: Dermatology

## 2023-12-14 DIAGNOSIS — Z86718 Personal history of other venous thrombosis and embolism: Secondary | ICD-10-CM | POA: Diagnosis not present

## 2023-12-14 DIAGNOSIS — Z7901 Long term (current) use of anticoagulants: Secondary | ICD-10-CM | POA: Diagnosis not present

## 2023-12-28 DIAGNOSIS — Z86718 Personal history of other venous thrombosis and embolism: Secondary | ICD-10-CM | POA: Diagnosis not present

## 2023-12-28 DIAGNOSIS — Z7901 Long term (current) use of anticoagulants: Secondary | ICD-10-CM | POA: Diagnosis not present

## 2024-01-17 DIAGNOSIS — Z7901 Long term (current) use of anticoagulants: Secondary | ICD-10-CM | POA: Diagnosis not present

## 2024-01-24 DIAGNOSIS — E039 Hypothyroidism, unspecified: Secondary | ICD-10-CM | POA: Diagnosis not present

## 2024-01-24 DIAGNOSIS — D6851 Activated protein C resistance: Secondary | ICD-10-CM | POA: Diagnosis not present

## 2024-01-24 DIAGNOSIS — G40909 Epilepsy, unspecified, not intractable, without status epilepticus: Secondary | ICD-10-CM | POA: Diagnosis not present

## 2024-01-24 DIAGNOSIS — G301 Alzheimer's disease with late onset: Secondary | ICD-10-CM | POA: Diagnosis not present

## 2024-01-24 DIAGNOSIS — G4733 Obstructive sleep apnea (adult) (pediatric): Secondary | ICD-10-CM | POA: Diagnosis not present

## 2024-01-24 DIAGNOSIS — Z86718 Personal history of other venous thrombosis and embolism: Secondary | ICD-10-CM | POA: Diagnosis not present

## 2024-01-24 DIAGNOSIS — E538 Deficiency of other specified B group vitamins: Secondary | ICD-10-CM | POA: Diagnosis not present

## 2024-01-24 DIAGNOSIS — E785 Hyperlipidemia, unspecified: Secondary | ICD-10-CM | POA: Diagnosis not present

## 2024-01-24 DIAGNOSIS — M1A09X0 Idiopathic chronic gout, multiple sites, without tophus (tophi): Secondary | ICD-10-CM | POA: Diagnosis not present

## 2024-01-24 DIAGNOSIS — F0280 Dementia in other diseases classified elsewhere without behavioral disturbance: Secondary | ICD-10-CM | POA: Diagnosis not present

## 2024-01-24 DIAGNOSIS — J439 Emphysema, unspecified: Secondary | ICD-10-CM | POA: Diagnosis not present

## 2024-02-14 DIAGNOSIS — Z86718 Personal history of other venous thrombosis and embolism: Secondary | ICD-10-CM | POA: Diagnosis not present

## 2024-02-14 DIAGNOSIS — Z7901 Long term (current) use of anticoagulants: Secondary | ICD-10-CM | POA: Diagnosis not present

## 2024-03-13 DIAGNOSIS — Z86718 Personal history of other venous thrombosis and embolism: Secondary | ICD-10-CM | POA: Diagnosis not present

## 2024-03-13 DIAGNOSIS — Z7901 Long term (current) use of anticoagulants: Secondary | ICD-10-CM | POA: Diagnosis not present

## 2024-03-19 ENCOUNTER — Ambulatory Visit: Admitting: Dermatology

## 2024-03-28 DIAGNOSIS — Z7901 Long term (current) use of anticoagulants: Secondary | ICD-10-CM | POA: Diagnosis not present

## 2024-03-28 DIAGNOSIS — Z86718 Personal history of other venous thrombosis and embolism: Secondary | ICD-10-CM | POA: Diagnosis not present

## 2024-05-13 DIAGNOSIS — Z86718 Personal history of other venous thrombosis and embolism: Secondary | ICD-10-CM | POA: Diagnosis not present

## 2024-05-13 DIAGNOSIS — Z7901 Long term (current) use of anticoagulants: Secondary | ICD-10-CM | POA: Diagnosis not present

## 2024-06-07 DIAGNOSIS — Z7901 Long term (current) use of anticoagulants: Secondary | ICD-10-CM | POA: Diagnosis not present

## 2024-06-07 DIAGNOSIS — Z86718 Personal history of other venous thrombosis and embolism: Secondary | ICD-10-CM | POA: Diagnosis not present

## 2024-07-24 DIAGNOSIS — Z86718 Personal history of other venous thrombosis and embolism: Secondary | ICD-10-CM | POA: Diagnosis not present

## 2024-07-24 DIAGNOSIS — Z7901 Long term (current) use of anticoagulants: Secondary | ICD-10-CM | POA: Diagnosis not present

## 2024-07-31 DIAGNOSIS — M5416 Radiculopathy, lumbar region: Secondary | ICD-10-CM | POA: Diagnosis not present

## 2024-07-31 DIAGNOSIS — L309 Dermatitis, unspecified: Secondary | ICD-10-CM | POA: Diagnosis not present

## 2024-07-31 DIAGNOSIS — E039 Hypothyroidism, unspecified: Secondary | ICD-10-CM | POA: Diagnosis not present

## 2024-07-31 DIAGNOSIS — E538 Deficiency of other specified B group vitamins: Secondary | ICD-10-CM | POA: Diagnosis not present

## 2024-07-31 DIAGNOSIS — Z0000 Encounter for general adult medical examination without abnormal findings: Secondary | ICD-10-CM | POA: Diagnosis not present

## 2024-07-31 DIAGNOSIS — Z23 Encounter for immunization: Secondary | ICD-10-CM | POA: Diagnosis not present

## 2024-07-31 DIAGNOSIS — Z86718 Personal history of other venous thrombosis and embolism: Secondary | ICD-10-CM | POA: Diagnosis not present

## 2024-07-31 DIAGNOSIS — G301 Alzheimer's disease with late onset: Secondary | ICD-10-CM | POA: Diagnosis not present

## 2024-07-31 DIAGNOSIS — R7303 Prediabetes: Secondary | ICD-10-CM | POA: Diagnosis not present

## 2024-07-31 DIAGNOSIS — D6851 Activated protein C resistance: Secondary | ICD-10-CM | POA: Diagnosis not present

## 2024-07-31 DIAGNOSIS — G40909 Epilepsy, unspecified, not intractable, without status epilepticus: Secondary | ICD-10-CM | POA: Diagnosis not present

## 2024-07-31 DIAGNOSIS — G4733 Obstructive sleep apnea (adult) (pediatric): Secondary | ICD-10-CM | POA: Diagnosis not present

## 2024-07-31 DIAGNOSIS — J439 Emphysema, unspecified: Secondary | ICD-10-CM | POA: Diagnosis not present

## 2024-08-02 ENCOUNTER — Other Ambulatory Visit: Payer: Self-pay | Admitting: Internal Medicine

## 2024-08-02 DIAGNOSIS — J439 Emphysema, unspecified: Secondary | ICD-10-CM

## 2024-08-02 DIAGNOSIS — R569 Unspecified convulsions: Secondary | ICD-10-CM

## 2024-09-02 DIAGNOSIS — Z86718 Personal history of other venous thrombosis and embolism: Secondary | ICD-10-CM | POA: Diagnosis not present

## 2024-09-02 DIAGNOSIS — Z7901 Long term (current) use of anticoagulants: Secondary | ICD-10-CM | POA: Diagnosis not present

## 2024-09-09 DIAGNOSIS — Z7901 Long term (current) use of anticoagulants: Secondary | ICD-10-CM | POA: Diagnosis not present

## 2024-09-09 DIAGNOSIS — Z86718 Personal history of other venous thrombosis and embolism: Secondary | ICD-10-CM | POA: Diagnosis not present
